# Patient Record
Sex: Female | Born: 1960 | Race: White | Hispanic: No | Marital: Married | Smoking: Former smoker
Health system: Southern US, Community
[De-identification: ages and names within clinical notes are randomized; demographics above are authoritative.]

## PROBLEM LIST (undated history)

## (undated) DIAGNOSIS — K219 Gastro-esophageal reflux disease without esophagitis: Secondary | ICD-10-CM

## (undated) DIAGNOSIS — A64 Unspecified sexually transmitted disease: Secondary | ICD-10-CM

## (undated) DIAGNOSIS — I10 Essential (primary) hypertension: Secondary | ICD-10-CM

## (undated) DIAGNOSIS — M8580 Other specified disorders of bone density and structure, unspecified site: Secondary | ICD-10-CM

## (undated) DIAGNOSIS — E079 Disorder of thyroid, unspecified: Secondary | ICD-10-CM

## (undated) DIAGNOSIS — K5790 Diverticulosis of intestine, part unspecified, without perforation or abscess without bleeding: Secondary | ICD-10-CM

## (undated) DIAGNOSIS — K222 Esophageal obstruction: Secondary | ICD-10-CM

## (undated) DIAGNOSIS — D126 Benign neoplasm of colon, unspecified: Secondary | ICD-10-CM

## (undated) HISTORY — DX: Disorder of thyroid, unspecified: E07.9

## (undated) HISTORY — DX: Benign neoplasm of colon, unspecified: D12.6

## (undated) HISTORY — DX: Esophageal obstruction: K22.2

## (undated) HISTORY — DX: Gastro-esophageal reflux disease without esophagitis: K21.9

## (undated) HISTORY — DX: Diverticulosis of intestine, part unspecified, without perforation or abscess without bleeding: K57.90

## (undated) HISTORY — PX: CARPAL TUNNEL RELEASE: SHX101

## (undated) HISTORY — DX: Unspecified sexually transmitted disease: A64

## (undated) HISTORY — DX: Essential (primary) hypertension: I10

## (undated) HISTORY — PX: TUBAL LIGATION: SHX77

## (undated) HISTORY — PX: FOOT SURGERY: SHX648

## (undated) HISTORY — DX: Other specified disorders of bone density and structure, unspecified site: M85.80

---

## 1999-05-15 ENCOUNTER — Encounter: Payer: Self-pay | Admitting: Obstetrics and Gynecology

## 1999-05-15 ENCOUNTER — Encounter: Admission: RE | Admit: 1999-05-15 | Discharge: 1999-05-15 | Payer: Self-pay | Admitting: Obstetrics and Gynecology

## 2002-01-13 ENCOUNTER — Encounter: Payer: Self-pay | Admitting: Otolaryngology

## 2002-01-13 ENCOUNTER — Encounter: Admission: RE | Admit: 2002-01-13 | Discharge: 2002-01-13 | Payer: Self-pay | Admitting: Otolaryngology

## 2003-04-22 ENCOUNTER — Other Ambulatory Visit: Admission: RE | Admit: 2003-04-22 | Discharge: 2003-04-22 | Payer: Self-pay | Admitting: Obstetrics and Gynecology

## 2003-05-16 ENCOUNTER — Encounter: Admission: RE | Admit: 2003-05-16 | Discharge: 2003-05-16 | Payer: Self-pay | Admitting: Otolaryngology

## 2004-09-05 ENCOUNTER — Other Ambulatory Visit: Admission: RE | Admit: 2004-09-05 | Discharge: 2004-09-05 | Payer: Self-pay | Admitting: Obstetrics and Gynecology

## 2006-02-21 ENCOUNTER — Other Ambulatory Visit: Admission: RE | Admit: 2006-02-21 | Discharge: 2006-02-21 | Payer: Self-pay | Admitting: Obstetrics and Gynecology

## 2007-07-20 ENCOUNTER — Other Ambulatory Visit: Admission: RE | Admit: 2007-07-20 | Discharge: 2007-07-20 | Payer: Self-pay | Admitting: Obstetrics and Gynecology

## 2008-07-27 ENCOUNTER — Other Ambulatory Visit: Admission: RE | Admit: 2008-07-27 | Discharge: 2008-07-27 | Payer: Self-pay | Admitting: Obstetrics and Gynecology

## 2008-07-27 ENCOUNTER — Ambulatory Visit: Payer: Self-pay | Admitting: Obstetrics and Gynecology

## 2008-07-27 ENCOUNTER — Encounter: Payer: Self-pay | Admitting: Obstetrics and Gynecology

## 2008-08-02 ENCOUNTER — Encounter: Admission: RE | Admit: 2008-08-02 | Discharge: 2008-08-02 | Payer: Self-pay | Admitting: Obstetrics and Gynecology

## 2008-10-25 ENCOUNTER — Ambulatory Visit: Payer: Self-pay | Admitting: Obstetrics and Gynecology

## 2009-01-23 ENCOUNTER — Ambulatory Visit: Payer: Self-pay | Admitting: Obstetrics and Gynecology

## 2009-01-23 ENCOUNTER — Ambulatory Visit: Payer: Self-pay | Admitting: Women's Health

## 2009-04-03 ENCOUNTER — Ambulatory Visit: Payer: Self-pay | Admitting: Obstetrics and Gynecology

## 2009-08-14 ENCOUNTER — Other Ambulatory Visit: Admission: RE | Admit: 2009-08-14 | Discharge: 2009-08-14 | Payer: Self-pay | Admitting: Obstetrics and Gynecology

## 2009-08-14 ENCOUNTER — Ambulatory Visit: Payer: Self-pay | Admitting: Obstetrics and Gynecology

## 2009-09-13 ENCOUNTER — Encounter: Admission: RE | Admit: 2009-09-13 | Discharge: 2009-09-13 | Payer: Self-pay | Admitting: Obstetrics and Gynecology

## 2010-04-11 ENCOUNTER — Ambulatory Visit: Payer: Self-pay | Admitting: Obstetrics and Gynecology

## 2010-06-05 ENCOUNTER — Other Ambulatory Visit: Payer: Self-pay | Admitting: Obstetrics and Gynecology

## 2010-06-05 ENCOUNTER — Ambulatory Visit
Admission: RE | Admit: 2010-06-05 | Discharge: 2010-06-05 | Payer: Self-pay | Source: Home / Self Care | Attending: Obstetrics and Gynecology | Admitting: Obstetrics and Gynecology

## 2010-06-27 ENCOUNTER — Ambulatory Visit: Payer: Self-pay | Admitting: Obstetrics and Gynecology

## 2010-06-27 ENCOUNTER — Other Ambulatory Visit: Payer: Self-pay

## 2010-06-27 ENCOUNTER — Other Ambulatory Visit: Payer: Managed Care, Other (non HMO)

## 2010-06-27 ENCOUNTER — Ambulatory Visit (INDEPENDENT_AMBULATORY_CARE_PROVIDER_SITE_OTHER): Payer: Managed Care, Other (non HMO) | Admitting: Obstetrics and Gynecology

## 2010-06-27 DIAGNOSIS — N949 Unspecified condition associated with female genital organs and menstrual cycle: Secondary | ICD-10-CM

## 2010-06-27 DIAGNOSIS — N920 Excessive and frequent menstruation with regular cycle: Secondary | ICD-10-CM

## 2010-06-27 DIAGNOSIS — D259 Leiomyoma of uterus, unspecified: Secondary | ICD-10-CM

## 2010-09-07 ENCOUNTER — Other Ambulatory Visit: Payer: Managed Care, Other (non HMO)

## 2010-09-11 ENCOUNTER — Institutional Professional Consult (permissible substitution) (INDEPENDENT_AMBULATORY_CARE_PROVIDER_SITE_OTHER): Payer: Managed Care, Other (non HMO) | Admitting: Obstetrics and Gynecology

## 2010-09-11 DIAGNOSIS — N921 Excessive and frequent menstruation with irregular cycle: Secondary | ICD-10-CM

## 2010-09-11 DIAGNOSIS — D250 Submucous leiomyoma of uterus: Secondary | ICD-10-CM

## 2010-09-11 DIAGNOSIS — N920 Excessive and frequent menstruation with regular cycle: Secondary | ICD-10-CM

## 2010-11-01 ENCOUNTER — Ambulatory Visit: Payer: Managed Care, Other (non HMO) | Admitting: Obstetrics and Gynecology

## 2010-11-02 ENCOUNTER — Inpatient Hospital Stay (HOSPITAL_COMMUNITY): Admission: RE | Admit: 2010-11-02 | Payer: Managed Care, Other (non HMO) | Source: Ambulatory Visit

## 2010-11-14 ENCOUNTER — Encounter (HOSPITAL_COMMUNITY): Admission: RE | Payer: Self-pay | Source: Ambulatory Visit

## 2010-11-14 ENCOUNTER — Ambulatory Visit (HOSPITAL_COMMUNITY)
Admission: RE | Admit: 2010-11-14 | Payer: Managed Care, Other (non HMO) | Source: Ambulatory Visit | Admitting: Obstetrics and Gynecology

## 2010-11-14 SURGERY — HYSTERECTOMY, VAGINAL
Anesthesia: General

## 2011-03-14 ENCOUNTER — Encounter: Payer: Self-pay | Admitting: Gynecology

## 2011-03-14 DIAGNOSIS — E079 Disorder of thyroid, unspecified: Secondary | ICD-10-CM | POA: Insufficient documentation

## 2011-03-14 DIAGNOSIS — I10 Essential (primary) hypertension: Secondary | ICD-10-CM | POA: Insufficient documentation

## 2011-03-15 ENCOUNTER — Encounter: Payer: Self-pay | Admitting: Women's Health

## 2011-03-15 ENCOUNTER — Other Ambulatory Visit (HOSPITAL_COMMUNITY)
Admission: RE | Admit: 2011-03-15 | Discharge: 2011-03-15 | Disposition: A | Discharge status: Home / Self Care | Payer: Managed Care, Other (non HMO) | Source: Ambulatory Visit | Attending: Women's Health | Admitting: Women's Health

## 2011-03-15 ENCOUNTER — Other Ambulatory Visit: Payer: Self-pay | Admitting: Obstetrics and Gynecology

## 2011-03-15 ENCOUNTER — Ambulatory Visit (INDEPENDENT_AMBULATORY_CARE_PROVIDER_SITE_OTHER): Payer: Managed Care, Other (non HMO) | Admitting: Women's Health

## 2011-03-15 VITALS — BP 130/80 | Ht 62.0 in | Wt 167.0 lb

## 2011-03-15 DIAGNOSIS — Z01419 Encounter for gynecological examination (general) (routine) without abnormal findings: Secondary | ICD-10-CM

## 2011-03-15 DIAGNOSIS — Z833 Family history of diabetes mellitus: Secondary | ICD-10-CM

## 2011-03-15 DIAGNOSIS — B373 Candidiasis of vulva and vagina: Secondary | ICD-10-CM

## 2011-03-15 DIAGNOSIS — E039 Hypothyroidism, unspecified: Secondary | ICD-10-CM

## 2011-03-15 DIAGNOSIS — R823 Hemoglobinuria: Secondary | ICD-10-CM

## 2011-03-15 DIAGNOSIS — Z13220 Encounter for screening for lipoid disorders: Secondary | ICD-10-CM

## 2011-03-15 DIAGNOSIS — L299 Pruritus, unspecified: Secondary | ICD-10-CM

## 2011-03-15 DIAGNOSIS — Z1231 Encounter for screening mammogram for malignant neoplasm of breast: Secondary | ICD-10-CM

## 2011-03-15 MED ORDER — TERCONAZOLE 0.4 % VA CREA: 1.0000 | TOPICAL_CREAM | Freq: Every day | VAGINAL | Status: AC

## 2011-03-15 MED ORDER — LEVOTHYROXINE SODIUM 100 MCG PO TABS
100.0000 ug | ORAL_TABLET | Freq: Every day | ORAL | Status: DC
Start: 2011-03-15 — End: 2012-03-17

## 2011-03-15 NOTE — Progress Notes (Signed)
Hannah Hanson 08/31/1960 [id number]    History:    The patient presents for annual exam.  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   Past medical history, past surgical history, family history and social history were all reviewed and documented in the EPIC chart.   ROS:  A  ROS was performed and pertinent positives and negatives are included in the history.  Exam:  Filed Vitals:   03/15/11 1202  BP: 130/80    General appearance:  Normal Head/Neck:  Normal, without cervical or supraclavicular adenopathy. Thyroid:  Symmetrical, normal in size, without palpable masses or nodularity. Respiratory  Effort:  Normal  Auscultation:  Clear without wheezing or rhonchi Cardiovascular  Auscultation:  Regular rate, without rubs, murmurs or gallops  Edema/varicosities:  Not grossly evident Abdominal  Soft,nontender, without masses, guarding or rebound.  Liver/spleen:  No organomegaly noted  Hernia:  None appreciated  Skin  Inspection:  Grossly normal  Palpation:  Grossly normal Neurologic/psychiatric  Orientation:  Normal with appropriate conversation.  Mood/affect:  Normal  Genitourinary    Breasts: Examined lying and sitting.     Right: Without masses, retractions, discharge or axillary adenopathy.     Left: Without masses, retractions, discharge or axillary adenopathy.   Inguinal/mons:  Normal without inguinal adenopathy  External genitalia:  Normal  BUS/Urethra/Skene's glands:  Normal  Bladder:  Normal  Vagina:  Normal  Cervix:  Normal  Uterus:  normal in size, shape and contour.  Midline and mobile  Adnexa/parametria:     Rt: Without masses or tenderness.   Lt: Without masses or tenderness.  Anus and perineum: Normal  Digital rectal exam: Normal sphincter tone without palpated masses or tenderness  Assessment/Plan:  50 y.o. MWF G4P3 for annual exam with numerous complaints. Monthly cycle, 5-10 days, usually under 7.  History of an intramural fibroid that displaces the endometrial cavity 43 x 29 mm noted on sonohysterogram January 2012. Was contemplating hysterectomy, states would prefer not since so close to menopause. History of normal mammograms, last one may of 2011. Has not had a colonoscopy. History of normal Paps.  Menorrhagia/fibroid Yeast Hypertension/depression-medications - primary care Hypothyroid/levothyroxine 100 mcg daily  Plan: CBC, glucose, TSH, UA and Pap. Prescription, proper use, levothyroxine 100 mcg daily, will triage based on results. Instructed to keep a menstrual calendar, return to office if cycles greater than 7 days or heavier than 1 pad per hour. SBEs, mammogram overdue, instructed to schedule. Encouraged exercise, vitamin D 1000 daily, calcium rich diet. Instructed to schedule colonoscopy, has numerous GI complaints, Dr. Matthias Hughs, Dr. Loreta Ave number was given. Terazol 7 one applicator at bedtime x7 and apply externally, most of her itching is external. Call if no relief.  Harrington Challenger [facility], 12:57 PM 03/15/2011

## 2011-03-20 ENCOUNTER — Other Ambulatory Visit: Payer: Self-pay | Admitting: *Deleted

## 2011-03-20 ENCOUNTER — Encounter: Payer: Self-pay | Admitting: Gastroenterology

## 2011-03-20 ENCOUNTER — Telehealth: Payer: Self-pay | Admitting: *Deleted

## 2011-03-20 DIAGNOSIS — Z1211 Encounter for screening for malignant neoplasm of colon: Secondary | ICD-10-CM

## 2011-03-20 NOTE — Telephone Encounter (Signed)
Patient needed help with setting up a screening colonoscopy.  Patient wanted to see [facility].  Set up with Dr. Christella Hartigan on 04/15/11 @11 :30 order in pc.  Patient informed packet to be sent out by their office.

## 2011-04-04 ENCOUNTER — Ambulatory Visit (AMBULATORY_SURGERY_CENTER): Payer: Managed Care, Other (non HMO)

## 2011-04-04 VITALS — Ht 61.0 in | Wt 166.0 lb

## 2011-04-04 DIAGNOSIS — Z1211 Encounter for screening for malignant neoplasm of colon: Secondary | ICD-10-CM

## 2011-04-04 MED ORDER — PEG-KCL-NACL-NASULF-NA ASC-C 100 G PO SOLR: 1.0000 | Freq: Once | ORAL | Status: DC

## 2011-04-05 ENCOUNTER — Encounter: Payer: Self-pay | Admitting: Gastroenterology

## 2011-04-10 ENCOUNTER — Ambulatory Visit
Admission: RE | Admit: 2011-04-10 | Discharge: 2011-04-10 | Disposition: A | Discharge status: Home / Self Care | Payer: Managed Care, Other (non HMO) | Source: Ambulatory Visit | Attending: Obstetrics and Gynecology | Admitting: Obstetrics and Gynecology

## 2011-04-10 DIAGNOSIS — Z1231 Encounter for screening mammogram for malignant neoplasm of breast: Secondary | ICD-10-CM

## 2011-04-15 ENCOUNTER — Other Ambulatory Visit: Payer: Self-pay | Admitting: Obstetrics and Gynecology

## 2011-04-15 ENCOUNTER — Other Ambulatory Visit: Payer: Self-pay | Admitting: *Deleted

## 2011-04-15 ENCOUNTER — Encounter: Payer: Self-pay | Admitting: Gastroenterology

## 2011-04-15 ENCOUNTER — Ambulatory Visit (AMBULATORY_SURGERY_CENTER): Payer: Managed Care, Other (non HMO) | Admitting: Gastroenterology

## 2011-04-15 VITALS — BP 117/55 | HR 66 | Temp 99.5°F | Resp 19 | Ht 61.0 in | Wt 166.0 lb

## 2011-04-15 DIAGNOSIS — Z1211 Encounter for screening for malignant neoplasm of colon: Secondary | ICD-10-CM

## 2011-04-15 DIAGNOSIS — N63 Unspecified lump in unspecified breast: Secondary | ICD-10-CM

## 2011-04-15 MED ORDER — SODIUM CHLORIDE 0.9 % IV SOLN: 500.0000 mL | INTRAVENOUS | Status: DC

## 2011-04-15 NOTE — Op Note (Signed)
[facility] [address] [place], [place]  [address]  COLONOSCOPY PROCEDURE REPORT  PATIENT:  Hannah Hanson, Hannah Hanson  MR#:  [id number] BIRTHDATE:  11/05/1960, 50 yrs. old  GENDER:  female ENDOSCOPIST:  Rachael Fee, MD REF. BY:  Burnell Blanks, M.D. PROCEDURE DATE:  04/15/2011 PROCEDURE:  Diagnostic Colonoscopy ASA CLASS:  Class II INDICATIONS:  Routine Risk Screening MEDICATIONS:  Fentanyl 75 mcg IV, These medications were titrated to patient response per physician's verbal order, Versed 8 mg IV  DESCRIPTION OF PROCEDURE:   After the risks benefits and alternatives of the procedure were thoroughly explained, informed consent was obtained.  Digital rectal exam was performed and revealed no rectal masses.   The [id number] endoscope was introduced through the anus and advanced to the cecum, which was identified by both the appendix and ileocecal valve, without limitations.  The quality of the prep was good..  The instrument was then slowly withdrawn as the colon was fully examined. <<PROCEDUREIMAGES>>  FINDINGS:  A normal appearing cecum, ileocecal valve, and appendiceal orifice were identified. The ascending, hepatic flexure, transverse, splenic flexure, descending, sigmoid colon, and rectum appeared unremarkable (see image1, image2, and image3). Retroflexed views in the rectum revealed no abnormalities.  COMPLICATIONS:  None  ENDOSCOPIC IMPRESSION: 1) Normal colon 2) No polyps or cancers  RECOMMENDATIONS: 1) You should continue to follow colorectal cancer screening guidelines for "routine risk" patients with a repeat colonoscopy in 10 years. There is no need for FOBT (stool) testing for at least 5 years.  REPEAT EXAM:  10 years  ______________________________ Rachael Fee, MD  n. eSIGNED:   Rachael Fee at 04/15/2011 11:30 AM  Jannifer Hick, [phone]

## 2011-04-15 NOTE — Progress Notes (Signed)
Pressure applied to the abdomen to reach cecum. 

## 2011-04-15 NOTE — Patient Instructions (Signed)
Please refer to your blue and neon green sheets for instructions regarding diet and activity for the rest of today.  You may resume your medications as you would normally take them.  

## 2011-04-15 NOTE — Progress Notes (Signed)
Patient did not experience any of the following events: a burn prior to discharge; a fall within the facility; wrong site/side/patient/procedure/implant event; or a hospital transfer or hospital admission upon discharge from the facility. (G8907) Patient did not have preoperative order for IV antibiotic SSI prophylaxis. (G8918)  

## 2011-04-16 ENCOUNTER — Telehealth: Payer: Self-pay | Admitting: *Deleted

## 2011-04-16 NOTE — Telephone Encounter (Signed)
Left message on number left in admitting with ok to leave that message that identified pt by first and last name. ewm

## 2011-04-18 ENCOUNTER — Other Ambulatory Visit: Payer: Managed Care, Other (non HMO)

## 2011-05-02 ENCOUNTER — Ambulatory Visit
Admission: RE | Admit: 2011-05-02 | Discharge: 2011-05-02 | Disposition: A | Discharge status: Home / Self Care | Payer: Managed Care, Other (non HMO) | Source: Ambulatory Visit | Attending: Gynecology | Admitting: Gynecology

## 2011-05-02 DIAGNOSIS — N63 Unspecified lump in unspecified breast: Secondary | ICD-10-CM

## 2011-10-14 ENCOUNTER — Other Ambulatory Visit: Payer: Self-pay | Admitting: Obstetrics and Gynecology

## 2011-10-15 ENCOUNTER — Other Ambulatory Visit: Payer: Self-pay | Admitting: *Deleted

## 2011-10-15 DIAGNOSIS — R928 Other abnormal and inconclusive findings on diagnostic imaging of breast: Secondary | ICD-10-CM

## 2011-12-30 ENCOUNTER — Ambulatory Visit
Admission: RE | Admit: 2011-12-30 | Discharge: 2011-12-30 | Disposition: A | Discharge status: Home / Self Care | Payer: BC Managed Care – PPO | Source: Ambulatory Visit | Attending: Obstetrics and Gynecology | Admitting: Obstetrics and Gynecology

## 2011-12-30 DIAGNOSIS — R928 Other abnormal and inconclusive findings on diagnostic imaging of breast: Secondary | ICD-10-CM

## 2012-01-30 ENCOUNTER — Encounter: Payer: Self-pay | Admitting: Women's Health

## 2012-01-30 ENCOUNTER — Ambulatory Visit (INDEPENDENT_AMBULATORY_CARE_PROVIDER_SITE_OTHER): Payer: BC Managed Care – PPO | Admitting: Women's Health

## 2012-01-30 DIAGNOSIS — N390 Urinary tract infection, site not specified: Secondary | ICD-10-CM

## 2012-01-30 DIAGNOSIS — N898 Other specified noninflammatory disorders of vagina: Secondary | ICD-10-CM

## 2012-01-30 DIAGNOSIS — R350 Frequency of micturition: Secondary | ICD-10-CM

## 2012-01-30 LAB — URINALYSIS W MICROSCOPIC + REFLEX CULTURE
Bilirubin Urine: NEGATIVE
Glucose, UA: NEGATIVE mg/dL
Hgb urine dipstick: NEGATIVE
Ketones, ur: NEGATIVE mg/dL
Protein, ur: NEGATIVE mg/dL
RBC / HPF: NONE SEEN RBC/hpf (ref <3)

## 2012-01-30 LAB — WET PREP FOR TRICH, YEAST, CLUE
Trich, Wet Prep: NONE SEEN
Yeast Wet Prep HPF POC: NONE SEEN

## 2012-01-30 MED ORDER — FLUCONAZOLE 150 MG PO TABS: 150.0000 mg | ORAL_TABLET | Freq: Once | ORAL | Status: DC

## 2012-01-30 NOTE — Patient Instructions (Addendum)
Monilial Vaginitis Vaginitis in a soreness, swelling and redness (inflammation) of the vagina and vulva. Monilial vaginitis is not a sexually transmitted infection. CAUSES  Yeast vaginitis is caused by yeast (candida) that is normally found in your vagina. With a yeast infection, the candida has overgrown in number to a point that upsets the chemical balance. SYMPTOMS   White, thick vaginal discharge.   Swelling, itching, redness and irritation of the vagina and possibly the lips of the vagina (vulva).   Burning or painful urination.   Painful intercourse.  DIAGNOSIS  Things that may contribute to monilial vaginitis are:  Postmenopausal and virginal states.   Pregnancy.   Infections.   Being tired, sick or stressed, especially if you had monilial vaginitis in the past.   Diabetes. Good control will help lower the chance.   Birth control pills.   Tight fitting garments.   Using bubble bath, feminine sprays, douches or deodorant tampons.   Taking certain medications that kill germs (antibiotics).   Sporadic recurrence can occur if you become ill.  TREATMENT  Your caregiver will give you medication.  There are several kinds of anti monilial vaginal creams and suppositories specific for monilial vaginitis. For recurrent yeast infections, use a suppository or cream in the vagina 2 times a week, or as directed.   Anti-monilial or steroid cream for the itching or irritation of the vulva may also be used. Get your caregiver's permission.   Painting the vagina with methylene blue solution may help if the monilial cream does not work.   Eating yogurt may help prevent monilial vaginitis.  HOME CARE INSTRUCTIONS   Finish all medication as prescribed.   Do not have sex until treatment is completed or after your caregiver tells you it is okay.   Take warm sitz baths.   Do not douche.   Do not use tampons, especially scented ones.   Wear cotton underwear.   Avoid tight  pants and panty hose.   Tell your sexual partner that you have a yeast infection. They should go to their caregiver if they have symptoms such as mild rash or itching.   Your sexual partner should be treated as well if your infection is difficult to eliminate.   Practice safer sex. Use condoms.   Some vaginal medications cause latex condoms to fail. Vaginal medications that harm condoms are:   Cleocin cream.   Butoconazole (Femstat).   Terconazole (Terazol) vaginal suppository.   Miconazole (Monistat) (may be purchased over the counter).  SEEK MEDICAL CARE IF:   You have a temperature by mouth above 102 F (38.9 C).   The infection is getting worse after 2 days of treatment.   The infection is not getting better after 3 days of treatment.   You develop blisters in or around your vagina.   You develop vaginal bleeding, and it is not your menstrual period.   You have pain when you urinate.   You develop intestinal problems.   You have pain with sexual intercourse.  Document Released: 01/30/2005 Document Revised: 04/11/2011 Document Reviewed: 10/14/2008 ExitCare Patient Information 2012 [employer]. 

## 2012-01-30 NOTE — Progress Notes (Signed)
Patient ID: Hannah Hanson, female   DOB: 01-14-1961, 51 y.o.   MRN: [id number] Presents with the complaint of vaginal discharge with itching and irritation. States has had some urinary frequency and slight burning for one day. Used over-the-counter Monistat yesterday without relief. Denies a fever.  Exam: No CVAT. UA: Trace leukocytes, 0 did 2 WBCs, few bacteria, urine culture pending. External genitalia extremely erythematous, speculum exam vaginal walls are erythematous, copious curdy discharge mixed with vaginal cream. Wet prep negative.  Symptomatic yeast  Plan: Diflucan 150 by mouth x1 dose, prescription, proper use, given and reviewed. Instructed to call if no relief of symptoms. Urine culture pending.

## 2012-02-01 LAB — URINE CULTURE: Organism ID, Bacteria: NO GROWTH

## 2012-03-17 ENCOUNTER — Other Ambulatory Visit: Payer: Self-pay | Admitting: *Deleted

## 2012-03-17 MED ORDER — LEVOTHYROXINE SODIUM 100 MCG PO TABS: 100.0000 ug | ORAL_TABLET | Freq: Every day | ORAL | Status: DC

## 2012-03-18 ENCOUNTER — Ambulatory Visit (INDEPENDENT_AMBULATORY_CARE_PROVIDER_SITE_OTHER): Payer: BC Managed Care – PPO | Admitting: Women's Health

## 2012-03-18 ENCOUNTER — Encounter: Payer: Self-pay | Admitting: Women's Health

## 2012-03-18 VITALS — BP 124/76 | Ht 62.0 in | Wt 168.0 lb

## 2012-03-18 DIAGNOSIS — F419 Anxiety disorder, unspecified: Secondary | ICD-10-CM

## 2012-03-18 DIAGNOSIS — Z01419 Encounter for gynecological examination (general) (routine) without abnormal findings: Secondary | ICD-10-CM

## 2012-03-18 DIAGNOSIS — F329 Major depressive disorder, single episode, unspecified: Secondary | ICD-10-CM | POA: Insufficient documentation

## 2012-03-18 DIAGNOSIS — F341 Dysthymic disorder: Secondary | ICD-10-CM

## 2012-03-18 DIAGNOSIS — Z833 Family history of diabetes mellitus: Secondary | ICD-10-CM

## 2012-03-18 DIAGNOSIS — Z13220 Encounter for screening for lipoid disorders: Secondary | ICD-10-CM

## 2012-03-18 DIAGNOSIS — F32A Depression, unspecified: Secondary | ICD-10-CM | POA: Insufficient documentation

## 2012-03-18 DIAGNOSIS — E039 Hypothyroidism, unspecified: Secondary | ICD-10-CM

## 2012-03-18 MED ORDER — LEVOTHYROXINE SODIUM 100 MCG PO TABS: 100.0000 ug | ORAL_TABLET | Freq: Every day | ORAL | Status: DC

## 2012-03-18 MED ORDER — SERTRALINE HCL 50 MG PO TABS: 50.0000 mg | ORAL_TABLET | Freq: Every day | ORAL | Status: DC

## 2012-03-18 NOTE — Progress Notes (Signed)
Hannah Hanson 1960-07-23 [id number]    History:    The patient presents for annual exam.  Monthly 5-7 day cycle/BTL. Hypothyroid- Synthroid 100 mcg, anxiety/depression- Zoloft 50 mg daily. Hypertension, lisinopril per primary care. History of normal  mammogram 2013 after ultrasound and diagnostic mammogram of left breast. Pap 2010 - endometrial cells present with a negative endometrial biopsy, normal Paps after. Negative colonoscopy 04/2011. HSV with rare outbreaks. History of elevated cholesterol with low HDL, without followup. Has occasional reflux and right upper quadrant pain.   Past medical history, past surgical history, family history and social history were all reviewed and documented in the EPIC chart. Works at [employer] part time. 3 children, 28, 26, 25 all struggle with anxiety. History of GDM. Mother with history of diabetes and hypertension.   ROS:  A  ROS was performed and pertinent positives and negatives are included in the history.  Exam:  Filed Vitals:   03/18/12 1404  BP: 124/76    General appearance:  Normal Head/Neck:  Normal, without cervical or supraclavicular adenopathy. Thyroid:  Symmetrical, normal in size, without palpable masses or nodularity. Respiratory  Effort:  Normal  Auscultation:  Clear without wheezing or rhonchi Cardiovascular  Auscultation:  Regular rate, without rubs, murmurs or gallops  Edema/varicosities:  Not grossly evident Abdominal  Soft,nontender, without masses, guarding or rebound.  Liver/spleen:  No organomegaly noted  Hernia:  None appreciated  Skin  Inspection:  Grossly normal  Palpation:  Grossly normal Neurologic/psychiatric  Orientation:  Normal with appropriate conversation.  Mood/affect:  Normal  Genitourinary    Breasts: Examined lying and sitting.     Right: Without masses, retractions, discharge or axillary adenopathy.     Left: Without masses, retractions, discharge or axillary adenopathy.   Inguinal/mons:  Normal  without inguinal adenopathy  External genitalia:  Normal  BUS/Urethra/Skene's glands:  Normal  Bladder:  Normal  Vagina:  Normal  Cervix:  Normal  Uterus:   normal in size, shape and contour.  Midline and mobile  Adnexa/parametria:     Rt: Without masses or tenderness.   Lt: Without masses or tenderness.  Anus and perineum: Normal  Digital rectal exam: Normal sphincter tone without palpated masses or tenderness  Assessment/Plan:  51 y.o. M. WF G4 P3  for annual exam.     Normal GYN exam/BTL Hypothyroid-Synthroid Hypertension-lisinopril-primary care Untreated  hypercholesteremia Anxiety/depression-stable on Zoloft 50 Reflux and occasional right upper quadrant pain  Plan: Synthroid 100 mcg prescription, proper use given and reviewed, will triage note for change on TSH. Zoloft 50 mg by mouth daily prescription, proper use given and reviewed. Declines need for counseling, has had in the past. SBE's, continue annual mammogram, calcium rich diet, vitamin D 2000 daily, increase exercise and decrease calories for weight loss. Menopause reviewed. CBC, glucose, lipid panel, TSH, UA. Pap normal 2012, new screening guidelines reviewed. Reviewed importance of following up with primary care for lisinopril, reflux, questionable gallbladder issues and probable need for cholesterol medication.   Harrington Challenger WHNP, 3:15 PM 03/18/2012

## 2012-03-18 NOTE — Patient Instructions (Addendum)

## 2012-03-19 ENCOUNTER — Encounter: Payer: Self-pay | Admitting: Obstetrics and Gynecology

## 2012-03-19 LAB — URINALYSIS W MICROSCOPIC + REFLEX CULTURE
Bilirubin Urine: NEGATIVE
Hgb urine dipstick: NEGATIVE
Ketones, ur: NEGATIVE mg/dL
Protein, ur: NEGATIVE mg/dL
Urobilinogen, UA: 0.2 mg/dL (ref 0.0–1.0)

## 2012-03-19 LAB — LIPID PANEL
Cholesterol: 206 mg/dL — ABNORMAL HIGH (ref 0–200)
HDL: 37 mg/dL — ABNORMAL LOW (ref >39)
Total CHOL/HDL Ratio: 5.6 Ratio
VLDL: 64 mg/dL — ABNORMAL HIGH (ref 0–40)

## 2012-03-19 LAB — CBC WITH DIFFERENTIAL/PLATELET
Basophils Relative: 0 % (ref 0–1)
HCT: 43.4 % (ref 36.0–46.0)
Hemoglobin: 15 g/dL (ref 12.0–15.0)
MCH: 31.3 pg (ref 26.0–34.0)
MCHC: 34.6 g/dL (ref 30.0–36.0)
Monocytes Absolute: 0.8 10*3/uL (ref 0.1–1.0)
Monocytes Relative: 8 % (ref 3–12)
Neutro Abs: 5.6 10*3/uL (ref 1.7–7.7)

## 2012-03-19 LAB — TSH: TSH: 2.171 u[IU]/mL (ref 0.350–4.500)

## 2012-03-26 ENCOUNTER — Other Ambulatory Visit: Payer: Self-pay | Admitting: *Deleted

## 2012-03-26 DIAGNOSIS — E039 Hypothyroidism, unspecified: Secondary | ICD-10-CM

## 2012-03-26 MED ORDER — LEVOTHYROXINE SODIUM 100 MCG PO TABS: 100.0000 ug | ORAL_TABLET | Freq: Every day | ORAL | Status: DC

## 2012-08-04 ENCOUNTER — Other Ambulatory Visit: Payer: Self-pay | Admitting: Women's Health

## 2012-08-04 DIAGNOSIS — N6320 Unspecified lump in the left breast, unspecified quadrant: Secondary | ICD-10-CM

## 2012-08-05 ENCOUNTER — Other Ambulatory Visit: Payer: Self-pay | Admitting: *Deleted

## 2012-08-05 DIAGNOSIS — N63 Unspecified lump in unspecified breast: Secondary | ICD-10-CM

## 2012-08-27 ENCOUNTER — Ambulatory Visit
Admission: RE | Admit: 2012-08-27 | Discharge: 2012-08-27 | Disposition: A | Discharge status: Home / Self Care | Payer: BC Managed Care – PPO | Source: Ambulatory Visit | Attending: Women's Health | Admitting: Women's Health

## 2012-08-27 DIAGNOSIS — N63 Unspecified lump in unspecified breast: Secondary | ICD-10-CM

## 2012-12-30 ENCOUNTER — Other Ambulatory Visit: Payer: Self-pay | Admitting: Family Medicine

## 2012-12-30 DIAGNOSIS — R1310 Dysphagia, unspecified: Secondary | ICD-10-CM

## 2012-12-31 ENCOUNTER — Ambulatory Visit
Admission: RE | Admit: 2012-12-31 | Discharge: 2012-12-31 | Disposition: A | Discharge status: Home / Self Care | Payer: BC Managed Care – PPO | Source: Ambulatory Visit | Attending: Family Medicine | Admitting: Family Medicine

## 2012-12-31 DIAGNOSIS — R1310 Dysphagia, unspecified: Secondary | ICD-10-CM

## 2013-01-12 ENCOUNTER — Encounter: Payer: Self-pay | Admitting: Gastroenterology

## 2013-01-28 ENCOUNTER — Encounter: Payer: Self-pay | Admitting: Gastroenterology

## 2013-02-15 ENCOUNTER — Encounter: Payer: Self-pay | Admitting: Gastroenterology

## 2013-02-15 ENCOUNTER — Ambulatory Visit (INDEPENDENT_AMBULATORY_CARE_PROVIDER_SITE_OTHER): Payer: BC Managed Care – PPO | Admitting: Gastroenterology

## 2013-02-15 VITALS — BP 120/88 | HR 72 | Ht 62.0 in | Wt 167.0 lb

## 2013-02-15 DIAGNOSIS — R1310 Dysphagia, unspecified: Secondary | ICD-10-CM

## 2013-02-15 DIAGNOSIS — K222 Esophageal obstruction: Secondary | ICD-10-CM

## 2013-02-15 NOTE — Patient Instructions (Signed)
You will be set up for an upper endoscopy with balloon dilation for dysphagia, known esophageal stricture.

## 2013-02-15 NOTE — Progress Notes (Signed)
Review of pertinent gastrointestinal problems: 1. Routine risk for colon cancer, colonoscopy Dr. Christella Hartigan December 2012 done for routine risk screening. Colonoscopy was normal. Recommended recall at 10 year interval.   HPI: This is a   very pleasant 52 year old woman whom I am meeting in the office for the first time.  Has intermittent dysphagia 2-3 times per week.  Usually just solids, rarely liquids.  Usually relaxes and the bolus eases through. Vomited once.  Goes back 1-2 years.  + pyrosis, burning in chest.  Pepcid intemrittently for about a year.  More recently nexium.  Overall weight is up 20 pounds in 3 years.    from Barium esophagram 12/2012 done for dysphagia: Small hiatal hernia with a mucosal ring. This causes delayed passage at the gastroesophageal junction, suspicious for mild but clinically significant Schatzki ring.    Review of systems: Pertinent positive and negative review of systems were noted in the above HPI section. Complete review of systems was performed and was otherwise normal.    Past Medical History  Diagnosis Date  . STD (sexually transmitted disease)     HSV  . Hypertension   . Thyroid disease     Hypothyroid-Nodule    Past Surgical History  Procedure Laterality Date  . Tubal ligation    . Foot surgery      left  . Cesarean section  [id number], [id number]    Current Outpatient Prescriptions  Medication Sig Dispense Refill  . IRON PO Take by mouth.      . levothyroxine (SYNTHROID, LEVOTHROID) 100 MCG tablet Take 1 tablet (100 mcg total) by mouth daily.  90 tablet  12  . lisinopril (PRINIVIL,ZESTRIL) 10 MG tablet Take 10 mg by mouth daily.        . Multiple Vitamin (MULTIVITAMIN) tablet Take 1 tablet by mouth daily.        . sertraline (ZOLOFT) 50 MG tablet Take 1 tablet (50 mg total) by mouth daily.  90 tablet  4   No current facility-administered medications for this visit.    Allergies as of 02/15/2013 - Review Complete 02/15/2013  Allergen  Reaction Noted  . Sulfa antibiotics Rash 10/25/2010    Family History  Problem Relation Age of Onset  . Diabetes Mother   . Hypertension Mother   . Heart disease Paternal Grandfather   . Colon cancer Neg Hx     History   Social History  . Marital Status: Married    Spouse Name: N/A    Number of Children: N/A  . Years of Education: N/A   Occupational History  . Not on file.   Social History Main Topics  . Smoking status: Former Games developer  . Smokeless tobacco: Never Used  . Alcohol Use: No  . Drug Use: No  . Sexual Activity: Yes    Partners: Male    Birth Control/ Protection: Surgical     Comment: BTL   Other Topics Concern  . Not on file   Social History Narrative  . No narrative on file       Physical Exam: BP 120/88  Pulse 72  Ht 5\' 2"  (1.575 m)  Wt 167 lb (75.751 kg)  BMI 30.54 kg/m2  LMP 01/15/2013 Constitutional: generally well-appearing Psychiatric: alert and oriented x3 Eyes: extraocular movements intact Mouth: oral pharynx moist, no lesions Neck: supple no lymphadenopathy Cardiovascular: heart regular rate and rhythm Lungs: clear to auscultation bilaterally Abdomen: soft, nontender, nondistended, no obvious ascites, no peritoneal signs, normal bowel sounds Extremities: no lower  extremity edema bilaterally Skin: no lesions on visible extremities    Assessment and plan: 51 y.o. female with  intermittent, nonprogressive, predominantly solid food dysphagia with abnormal barium esophagram  She likely has a Schatzki's ring. I recommended that we proceed with EGD and balloon dilation at her soonest convenience. In the meantime he'll chew her food well, eat slowly and take small bites. She has only intermittent GERD for which H2 blocker has been helping her. She does continue that as needed.

## 2013-03-02 ENCOUNTER — Encounter: Payer: Self-pay | Admitting: Gastroenterology

## 2013-03-02 ENCOUNTER — Ambulatory Visit (AMBULATORY_SURGERY_CENTER): Payer: BC Managed Care – PPO | Admitting: Gastroenterology

## 2013-03-02 VITALS — BP 108/68 | HR 58 | Temp 98.1°F | Resp 14 | Ht 62.0 in | Wt 167.0 lb

## 2013-03-02 DIAGNOSIS — Q391 Atresia of esophagus with tracheo-esophageal fistula: Secondary | ICD-10-CM

## 2013-03-02 DIAGNOSIS — R1310 Dysphagia, unspecified: Secondary | ICD-10-CM

## 2013-03-02 DIAGNOSIS — K222 Esophageal obstruction: Secondary | ICD-10-CM

## 2013-03-02 MED ORDER — SODIUM CHLORIDE 0.9 % IV SOLN: 500.0000 mL | INTRAVENOUS | Status: DC

## 2013-03-02 NOTE — Progress Notes (Signed)
Patient did not experience any of the following events: a burn prior to discharge; a fall within the facility; wrong site/side/patient/procedure/implant event; or a hospital transfer or hospital admission upon discharge from the facility. (G8907) Patient did not have preoperative order for IV antibiotic SSI prophylaxis. (G8918)  

## 2013-03-02 NOTE — Patient Instructions (Signed)
YOU HAD AN ENDOSCOPIC PROCEDURE TODAY AT THE [facility]: Refer to the procedure report that was given to you for any specific questions about what was found during the examination.  If the procedure report does not answer your questions, please call your gastroenterologist to clarify.  If you requested that your care partner not be given the details of your procedure findings, then the procedure report has been included in a sealed envelope for you to review at your convenience later.  YOU SHOULD EXPECT: Some feelings of bloating in the abdomen. Passage of more gas than usual.  Walking can help get rid of the air that was put into your GI tract during the procedure and reduce the bloating. If you had a lower endoscopy (such as a colonoscopy or flexible sigmoidoscopy) you may notice spotting of blood in your stool or on the toilet paper. If you underwent a bowel prep for your procedure, then you may not have a normal bowel movement for a few days.  DIET: See dilation diet- Drink plenty of fluids but you should avoid alcoholic beverages for 24 hours.  ACTIVITY: Your care partner should take you home directly after the procedure.  You should plan to take it easy, moving slowly for the rest of the day.  You can resume normal activity the day after the procedure however you should NOT DRIVE or use heavy machinery for 24 hours (because of the sedation medicines used during the test).    SYMPTOMS TO REPORT IMMEDIATELY: A gastroenterologist can be reached at any hour.  During normal business hours, 8:30 AM to 5:00 PM Monday through Friday, call [phone].  After hours and on weekends, please call the GI answering service at [phone] who will take a message and have the physician on call contact you.   Following upper endoscopy (EGD)  Vomiting of blood or coffee ground material  New chest pain or pain under the shoulder blades  Painful or persistently difficult swallowing  New  shortness of breath  Fever of 100F or higher  Black, tarry-looking stools  FOLLOW UP: If any biopsies were taken you will be contacted by phone or by letter within the next 1-3 weeks.  Call your gastroenterologist if you have not heard about the biopsies in 3 weeks.  Our staff will call the home number listed on your records the next business day following your procedure to check on you and address any questions or concerns that you may have at that time regarding the information given to you following your procedure. This is a courtesy call and so if there is no answer at the home number and we have not heard from you through the emergency physician on call, we will assume that you have returned to your regular daily activities without incident.  SIGNATURES/CONFIDENTIALITY: You and/or your care partner have signed paperwork which will be entered into your electronic medical record.  These signatures attest to the fact that that the information above on your After Visit Summary has been reviewed and is understood.  Full responsibility of the confidentiality of this discharge information lies with you and/or your care-partner.  Dilation diet, hiatal hernia, stricture-handouts given  If swallowing does not improve or becomes worse again in the future please contact Dr. Christella Hartigan.

## 2013-03-02 NOTE — Op Note (Signed)
[facility] [address] [place], [address]   ENDOSCOPY PROCEDURE REPORT  PATIENT: Hannah, Hanson.  MR#: [id number] BIRTHDATE: December 24, 1960 , 52  yrs. old GENDER: Female ENDOSCOPIST: Rachael Fee, MD REFERRED BY:  Burnell Blanks, M.D. PROCEDURE DATE:  03/02/2013 PROCEDURE:  EGD, balloon dilation ASA CLASS:     Class II INDICATIONS:  intermittent dysphagia, abnormal barium esophagram. MEDICATIONS: Fentanyl 50 mcg IV, Versed 5 mg IV, and These medications were titrated to patient response per physician's verbal order TOPICAL ANESTHETIC: none  DESCRIPTION OF PROCEDURE: After the risks benefits and alternatives of the procedure were thoroughly explained, informed consent was obtained.  The [id number] endoscope was introduced through the mouth and advanced to the second portion of the duodenum. Without limitations.  The instrument was slowly withdrawn as the mucosa was fully examined.    There was a Schatzki's ring above a small hiatal hernia (1cm).  The ring was dilated with a CRE TTS balloon held inflated to 20mm for 1 minute.  There was usual minor mucosal tear and self limited oozing of blood following dilation.  The examination was otherwise normal. Retroflexed views revealed no abnormalities.     The scope was then withdrawn from the patient and the procedure completed. COMPLICATIONS: There were no complications.  ENDOSCOPIC IMPRESSION: There was a Schatzki's ring above a small hiatal hernia (1cm).  The ring was dilated with a CRE TTS balloon held inflated to 20mm for 1 minute.  There was usual minor mucosal tear and self limited oozing of blood following dilation.  The examination was otherwise normal.   RECOMMENDATIONS: You should notice improvement in your swallowing.  Please call if the swallowing is not better or if it becomes troublesome again in the future.   eSigned:  Rachael Fee, MD 03/02/2013 2:47 PM

## 2013-03-03 ENCOUNTER — Telehealth: Payer: Self-pay | Admitting: *Deleted

## 2013-03-03 NOTE — Telephone Encounter (Signed)
  Follow up Call-  Call back number 03/02/2013 04/15/2011  Post procedure Call Back phone  # [phone].O.K. TO LEAVE MESSAGE  Permission to leave phone message Yes -     Patient questions:  Do you have a fever, pain , or abdominal swelling? no Pain Score  0 *  Have you tolerated food without any problems? yes  Have you been able to return to your normal activities? yes  Do you have any questions about your discharge instructions: Diet   no Medications  no Follow up visit  no  Do you have questions or concerns about your Care? no  Actions: * If pain score is 4 or above: No action needed, pain <4.

## 2013-03-19 ENCOUNTER — Encounter: Payer: BC Managed Care – PPO | Admitting: Women's Health

## 2013-04-09 ENCOUNTER — Ambulatory Visit (INDEPENDENT_AMBULATORY_CARE_PROVIDER_SITE_OTHER): Payer: BC Managed Care – PPO | Admitting: Women's Health

## 2013-04-09 ENCOUNTER — Encounter: Payer: Self-pay | Admitting: Women's Health

## 2013-04-09 ENCOUNTER — Other Ambulatory Visit (HOSPITAL_COMMUNITY)
Admission: RE | Admit: 2013-04-09 | Discharge: 2013-04-09 | Disposition: A | Discharge status: Home / Self Care | Payer: BC Managed Care – PPO | Source: Ambulatory Visit | Attending: Gynecology | Admitting: Gynecology

## 2013-04-09 ENCOUNTER — Other Ambulatory Visit: Payer: Self-pay | Admitting: Women's Health

## 2013-04-09 VITALS — BP 122/78 | Ht 62.0 in | Wt 169.2 lb

## 2013-04-09 DIAGNOSIS — E039 Hypothyroidism, unspecified: Secondary | ICD-10-CM

## 2013-04-09 DIAGNOSIS — Z01419 Encounter for gynecological examination (general) (routine) without abnormal findings: Secondary | ICD-10-CM

## 2013-04-09 DIAGNOSIS — F411 Generalized anxiety disorder: Secondary | ICD-10-CM

## 2013-04-09 DIAGNOSIS — Z833 Family history of diabetes mellitus: Secondary | ICD-10-CM

## 2013-04-09 DIAGNOSIS — Z13220 Encounter for screening for lipoid disorders: Secondary | ICD-10-CM

## 2013-04-09 LAB — URINALYSIS W MICROSCOPIC + REFLEX CULTURE
Bilirubin Urine: NEGATIVE
Crystals: NONE SEEN
Glucose, UA: NEGATIVE mg/dL
Leukocytes, UA: NEGATIVE
Protein, ur: NEGATIVE mg/dL
Specific Gravity, Urine: 1.019 (ref 1.005–1.030)
Squamous Epithelial / LPF: NONE SEEN
Urobilinogen, UA: 0.2 mg/dL (ref 0.0–1.0)

## 2013-04-09 LAB — LIPID PANEL
Cholesterol: 201 mg/dL — ABNORMAL HIGH (ref 0–200)
Total CHOL/HDL Ratio: 5.3 Ratio

## 2013-04-09 LAB — CBC WITH DIFFERENTIAL/PLATELET
Basophils Absolute: 0 10*3/uL (ref 0.0–0.1)
Lymphocytes Relative: 37 % (ref 12–46)
Lymphs Abs: 1.7 10*3/uL (ref 0.7–4.0)
MCV: 89.4 fL (ref 78.0–100.0)
Neutro Abs: 2.3 10*3/uL (ref 1.7–7.7)
Neutrophils Relative %: 49 % (ref 43–77)
Platelets: 195 10*3/uL (ref 150–400)
RBC: 4.7 MIL/uL (ref 3.87–5.11)
RDW: 13.1 % (ref 11.5–15.5)
WBC: 4.6 10*3/uL (ref 4.0–10.5)

## 2013-04-09 LAB — COMPREHENSIVE METABOLIC PANEL
ALT: 27 U/L (ref 0–35)
AST: 24 U/L (ref 0–37)
CO2: 28 mEq/L (ref 19–32)
Calcium: 9.1 mg/dL (ref 8.4–10.5)
Chloride: 102 mEq/L (ref 96–112)
Creat: 0.77 mg/dL (ref 0.50–1.10)
Potassium: 4.1 mEq/L (ref 3.5–5.3)
Sodium: 138 mEq/L (ref 135–145)
Total Protein: 7.2 g/dL (ref 6.0–8.3)

## 2013-04-09 MED ORDER — BETAMETHASONE VALERATE 0.1 % EX OINT: 1.0000 "application " | TOPICAL_OINTMENT | Freq: Two times a day (BID) | CUTANEOUS | Status: DC

## 2013-04-09 MED ORDER — SERTRALINE HCL 50 MG PO TABS: 50.0000 mg | ORAL_TABLET | Freq: Every day | ORAL | Status: DC

## 2013-04-09 MED ORDER — LEVOTHYROXINE SODIUM 100 MCG PO TABS: 100.0000 ug | ORAL_TABLET | Freq: Every day | ORAL | Status: DC

## 2013-04-09 NOTE — Patient Instructions (Signed)

## 2013-04-09 NOTE — Progress Notes (Signed)
Hannah Hanson 1960/09/07 [id number]    History:    The patient presents for annual exam.  Monthly cycles/BTL. History of normal Paps and mammograms. Hypertension per primary care hypothyroid, anxiety and depression stable on Zoloft. Negative colonoscopy 2012. Dilated esophagus 03/02/2013 which corrected swallowing problem. HSV with rare outbreaks. History of gestational diabetes.   Past medical history, past surgical history, family history and social history were all reviewed and documented in the EPIC chart. Works part-time at [employer]. 3 children in their 23s all doing well. Sonohysterogram 2012 submucosal fibroid. Mother hypertension diet.   ROS:  A  ROS was performed and pertinent positives and negatives are included in the history.  Exam:  Filed Vitals:   04/09/13 0902  BP: 122/78    General appearance:  Normal Head/Neck:  Normal, without cervical or supraclavicular adenopathy. Thyroid:  Symmetrical, normal in size, without palpable masses or nodularity. Respiratory  Effort:  Normal  Auscultation:  Clear without wheezing or rhonchi Cardiovascular  Auscultation:  Regular rate, without rubs, murmurs or gallops  Edema/varicosities:  Not grossly evident Abdominal  Soft,nontender, without masses, guarding or rebound.  Liver/spleen:  No organomegaly noted  Hernia:  None appreciated  Skin  Inspection:  Grossly normal  Palpation:  Grossly normal Neurologic/psychiatric  Orientation:  Normal with appropriate conversation.  Mood/affect:  Normal  Genitourinary    Breasts: Examined lying and sitting.     Right: Without masses, retractions, discharge or axillary adenopathy.     Left: Without masses, retractions, discharge or axillary adenopathy.   Inguinal/mons:  Normal without inguinal adenopathy  External genitalia:  Normal  BUS/Urethra/Skene's glands:  Normal  Bladder:  Normal  Vagina:  Normal  Cervix:  Normal  Uterus:   normal in size, shape and contour.  Midline and  mobile  Adnexa/parametria:     Rt: Without masses or tenderness.   Lt: Without masses or tenderness.  Anus and perineum: Normal  Digital rectal exam: Normal sphincter tone without palpated masses or tenderness  Assessment/Plan:  52 y.o. MWF G3P3 for annual exam with no complaints.  Hypertension/primary care manages Hypothyroid on Synthroid 100 Anxiety/depression stable on Zoloft Monthly cycle/BTL  Plan: SBE's, continue annual mammogram, calcium rich diet, vitamin D 2000 daily encouraged. Menopause reviewed. Reviewed importance of increasing regular exercise and decreasing calories for weight loss. Zoloft 50 mg by mouth daily prescription, proper use given and reviewed. Synthroid 100 by mouth daily prescription, proper use given and reviewed. CBC, comprehensive metabolic panel, lipid panel, TSH, UA, Pap. Pap normal 2012, new screening guidelines reviewed. Instructed to print lab results and take to primary care.    Harrington Challenger [facility], 9:50 AM 04/09/2013

## 2013-04-09 NOTE — Addendum Note (Signed)
Addended by: Richardson Chiquito on: 04/09/2013 11:18 AM   Modules accepted: Orders

## 2014-01-25 ENCOUNTER — Encounter: Payer: Self-pay | Admitting: Women's Health

## 2014-01-25 ENCOUNTER — Ambulatory Visit (INDEPENDENT_AMBULATORY_CARE_PROVIDER_SITE_OTHER): Payer: BC Managed Care – PPO | Admitting: Women's Health

## 2014-01-25 DIAGNOSIS — N898 Other specified noninflammatory disorders of vagina: Secondary | ICD-10-CM

## 2014-01-25 LAB — WET PREP FOR TRICH, YEAST, CLUE
CLUE CELLS WET PREP: NONE SEEN
Trich, Wet Prep: NONE SEEN
Yeast Wet Prep HPF POC: NONE SEEN

## 2014-01-25 NOTE — Progress Notes (Signed)
Patient ID: Brandalyn Harting, female   DOB: May 12, 1960, 53 y.o.   MRN: [id number] Presents with complaint of vaginal discharge and left labial irritation for 2 days.  Denies UTI symptoms, fever or abd pain.  Denies HSV history.  Cycles irregular for past 6 months, LMP 12-04-2013, BTL.  Exam:  Appears well, external genitalia, left labia 1-2 cm dry superficial skin irritation, speculum exam, scant white discharge with no visible erythema or odor.  Wet prep negative.  Bimanual, no CMT, adnexal tenderness.  Left labial irritation  Plan: Neosporin twice daily, call if persists after one week. Check FSH at annual exam in Dec.

## 2014-03-07 ENCOUNTER — Encounter: Payer: Self-pay | Admitting: Women's Health

## 2014-04-12 ENCOUNTER — Encounter: Payer: BC Managed Care – PPO | Admitting: Women's Health

## 2014-04-23 ENCOUNTER — Other Ambulatory Visit: Payer: Self-pay | Admitting: Women's Health

## 2014-04-26 ENCOUNTER — Other Ambulatory Visit: Payer: Self-pay | Admitting: Women's Health

## 2014-05-11 ENCOUNTER — Encounter: Payer: Self-pay | Admitting: Women's Health

## 2014-05-11 ENCOUNTER — Ambulatory Visit (INDEPENDENT_AMBULATORY_CARE_PROVIDER_SITE_OTHER): Payer: BLUE CROSS/BLUE SHIELD | Admitting: Women's Health

## 2014-05-11 VITALS — BP 128/80 | Ht 61.0 in | Wt 161.0 lb

## 2014-05-11 DIAGNOSIS — Z01419 Encounter for gynecological examination (general) (routine) without abnormal findings: Secondary | ICD-10-CM

## 2014-05-11 DIAGNOSIS — E038 Other specified hypothyroidism: Secondary | ICD-10-CM

## 2014-05-11 DIAGNOSIS — F4322 Adjustment disorder with anxiety: Secondary | ICD-10-CM

## 2014-05-11 MED ORDER — SERTRALINE HCL 50 MG PO TABS: 50.0000 mg | ORAL_TABLET | Freq: Every day | ORAL | Status: DC

## 2014-05-11 MED ORDER — LEVOTHYROXINE SODIUM 100 MCG PO TABS: 100.0000 ug | ORAL_TABLET | Freq: Every day | ORAL | Status: DC

## 2014-05-11 NOTE — Patient Instructions (Signed)

## 2014-05-11 NOTE — Progress Notes (Signed)
Donnita Farina April 13, 1961 [id number]    History:    Presents for annual exam.  Monthly 3-4 day cycle lighter but continues monthly/BTL. Normal Pap and mammogram history. History of HSV rare outbreaks. Hypertension and hypothyroidism managed by primary care. Anxiety/depression stable on Zoloft. 2012 benign colonoscopy. History of GDM.   Past medical history, past surgical history, family history and social history were all reviewed and documented in the EPIC chart. Homemaker, part-time church activities. Mother hypertension. 3 children all have  anxiety.  ROS:  A ROS was performed and pertinent positives and negatives are included.  Exam:  Filed Vitals:   05/11/14 1056  BP: 128/80    General appearance:  Normal Thyroid:  Symmetrical, normal in size, without palpable masses or nodularity. Respiratory  Auscultation:  Clear without wheezing or rhonchi Cardiovascular  Auscultation:  Regular rate, without rubs, murmurs or gallops  Edema/varicosities:  Not grossly evident Abdominal  Soft,nontender, without masses, guarding or rebound.  Liver/spleen:  No organomegaly noted  Hernia:  None appreciated  Skin  Inspection:  Grossly normal   Breasts: Examined lying and sitting.     Right: Without masses, retractions, discharge or axillary adenopathy.     Left: Without masses, retractions, discharge or axillary adenopathy. Gentitourinary   Inguinal/mons:  Normal without inguinal adenopathy  External genitalia:  Normal  BUS/Urethra/Skene's glands:  Normal  Vagina:  Normal  Cervix:  Normal  Uterus:   normal in size, shape and contour.  Midline and mobile  Adnexa/parametria:     Rt: Without masses or tenderness.   Lt: Without masses or tenderness.  Anus and perineum: Normal  Digital rectal exam: Normal sphincter tone without palpated masses or tenderness  Assessment/Plan:  54 y.o. MWF G3 P3  for annual exam with no complaints.  Light monthly cycle/BTL Hypertension/hypothyroidism primary  care manages labs and meds Anxiety/depression stable on Zoloft  Plan: Zoloft 50 mg by mouth daily prescription, proper use given and reviewed, counseling as needed. Reviewed importance of regular exercise. SBE's, continue annual mammogram, 3-D tomography reviewed and encouraged history of dense breasts, calcium rich diet, vitamin D 1000 daily encouraged. Menopause reviewed denies symptoms. Pap normal 2014, new screening guidelines reviewed.    [place], 1:05 PM 05/11/2014

## 2014-05-12 LAB — URINALYSIS W MICROSCOPIC + REFLEX CULTURE
BILIRUBIN URINE: NEGATIVE
Bacteria, UA: NONE SEEN
CRYSTALS: NONE SEEN
Casts: NONE SEEN
Glucose, UA: NEGATIVE mg/dL
Hgb urine dipstick: NEGATIVE
Ketones, ur: NEGATIVE mg/dL
LEUKOCYTES UA: NEGATIVE
Nitrite: NEGATIVE
PROTEIN: NEGATIVE mg/dL
SQUAMOUS EPITHELIAL / LPF: NONE SEEN
Specific Gravity, Urine: 1.014 (ref 1.005–1.030)
UROBILINOGEN UA: 0.2 mg/dL (ref 0.0–1.0)
pH: 6.5 (ref 5.0–8.0)

## 2014-05-20 ENCOUNTER — Other Ambulatory Visit: Payer: Self-pay | Admitting: Family Medicine

## 2014-05-20 DIAGNOSIS — N951 Menopausal and female climacteric states: Secondary | ICD-10-CM

## 2014-05-20 DIAGNOSIS — Z1231 Encounter for screening mammogram for malignant neoplasm of breast: Secondary | ICD-10-CM

## 2014-05-27 ENCOUNTER — Ambulatory Visit: Payer: Self-pay

## 2014-05-27 ENCOUNTER — Other Ambulatory Visit: Payer: Self-pay

## 2014-06-02 ENCOUNTER — Ambulatory Visit
Admission: RE | Admit: 2014-06-02 | Discharge: 2014-06-02 | Disposition: A | Discharge status: Home / Self Care | Payer: BLUE CROSS/BLUE SHIELD | Source: Ambulatory Visit | Attending: Family Medicine | Admitting: Family Medicine

## 2014-06-02 DIAGNOSIS — Z1231 Encounter for screening mammogram for malignant neoplasm of breast: Secondary | ICD-10-CM

## 2014-06-02 DIAGNOSIS — N951 Menopausal and female climacteric states: Secondary | ICD-10-CM

## 2014-08-24 ENCOUNTER — Other Ambulatory Visit: Payer: Self-pay | Admitting: Women's Health

## 2014-08-24 NOTE — Telephone Encounter (Signed)
Okay to call in Synthroid 100 g by mouth daily with refills through January 2017.

## 2014-09-22 ENCOUNTER — Other Ambulatory Visit: Payer: Self-pay | Admitting: Women's Health

## 2014-11-14 ENCOUNTER — Ambulatory Visit
Payer: BLUE CROSS/BLUE SHIELD | Attending: Family Medicine | Admitting: Rehabilitative and Restorative Service Providers"

## 2014-11-14 DIAGNOSIS — R42 Dizziness and giddiness: Secondary | ICD-10-CM | POA: Diagnosis not present

## 2014-11-14 DIAGNOSIS — R269 Unspecified abnormalities of gait and mobility: Secondary | ICD-10-CM | POA: Diagnosis present

## 2014-11-14 NOTE — Patient Instructions (Addendum)
   Gaze Stabilization: Tip Card 1.Target must remain in focus, not blurry, and appear stationary while head is in motion. 2.Perform exercises with small head movements (45 to either side of midline). 3.Increase speed of head motion so long as target is in focus. 4.If you wear eyeglasses, be sure you can see target through lens (therapist will give specific instructions for bifocal / progressive lenses). 5.These exercises may provoke dizziness or nausea. Work through these symptoms. If too dizzy, slow head movement slightly. Rest between each exercise. 6.Exercises demand concentration; avoid distractions. 7.For safety, perform standing exercises close to a counter, wall, corner, or next to someone.  Copyright  VHI. All rights reserved.  Gaze Stabilization: Standing Feet Apart   Feet shoulder width apart, keeping eyes on target on wall 3 feet away, tilt head down slightly and move head side to side for 30 seconds. Repeat while moving head up and down for 30 seconds. Do 2 sessions per day.   Copyright  VHI. All rights reserved.    Keep a journal (notes) about any episodes of vertigo describing: 1) how long it lasts 2) what brings on symptoms 3) what makes symptoms decrease 4) other symptoms associated with vertigo (nausea, headache, light sensitivity, imbalance, hearing changes, etc)

## 2014-11-15 NOTE — Therapy (Signed)
[facility] [address] [place], [place], [address] Phone: [phone]   Fax:  [phone]  Physical Therapy Evaluation  Patient Details  Name: Hannah Hanson MRN: [id number] Date of Birth: 07-01-1960 Referring Provider:  Leonides Sake, MD  Encounter Date: 11/14/2014      PT End of Session - 11/15/14 0801    Visit Number 1   Number of Visits 4   Date for PT Re-Evaluation 12/15/14   Authorization Type BCBS 30 visit limit   PT Start Time 1020   PT Stop Time 1105   PT Time Calculation (min) 45 min   Activity Tolerance Patient tolerated treatment well   Behavior During Therapy [facility] for tasks assessed/performed      Past Medical History  Diagnosis Date  . STD (sexually transmitted disease)     HSV  . Hypertension   . Thyroid disease     Hypothyroid-Nodule    Past Surgical History  Procedure Laterality Date  . Tubal ligation    . Foot surgery      left  . Cesarean section  1985, [id number]    There were no vitals filed for this visit.  Visit Diagnosis:  Dizziness and giddiness  Abnormality of gait      Subjective Assessment - 11/14/14 1025    Subjective The patient reports a severe bout of vertig in 05/2014 upon waking described as room spinning sensation lasting 3 days.  She remained in bed x days.  Her symptoms gradually improved after January taking 2-3 weeks to return to normal.  She had a second bout of dizziness beginning a month ago noted upon waking lasting x 1.5 days.  The patient had to remain in bed.  She awoke 2 weeks ago in the morning with dizziness and went back to bed and slept x 4 hours and she was able to get up.  She reports it takes several days to return to normal.  Baseline dizziness is <1/10.  "I never feel like I'm totally focused."  She does not note hearing changes.  During bouts of dizziness, she holds onto walls and furniture to walk.   Pertinent History Negative h/o migraines,  positive for headaches (sinus related) noted as pressure with light sensitivity.  Allergy medication began in the fall.    Patient Stated Goals help prevent episodes from returning.     Currently in Pain? No/denies            [facility] PT Assessment - 11/14/14 1034    Assessment   Medical Diagnosis --  vertigo   Onset Date/Surgical Date --  January 2016   Prior Therapy --  none   Balance Screen   Has the patient fallen in the past 6 months No   Has the patient had a decrease in activity level because of a fear of falling?  No   Is the patient reluctant to leave their home because of a fear of falling?  No   Home Environment   Living Environment Private residence   Living Arrangements Spouse/significant other   Observation/Other Assessments   Focus on Therapeutic Outcomes (FOTO)  52%            Vestibular Assessment - 11/14/14 1035    Symptom Behavior   Type of Dizziness Spinning   Frequency of Dizziness --  intermittent   Duration of Dizziness --  days   Aggravating Factors Spontaneous onset   Relieving Factors Lying supine;Head stationary   Occulomotor Exam  Occulomotor Alignment Normal   Spontaneous Absent   Gaze-induced Absent   Smooth Pursuits Intact   Saccades Intact   Vestibulo-Occular Reflex   VOR 1 Head Only (x 1 viewing) --  10 reps without symptoms at self regulated pace   Comment head thrust positive ot the right side   Positional Testing   Dix-Hallpike Dix-Hallpike Right;Dix-Hallpike Left   Sidelying Test Sidelying Right;Sidelying Left   Horizontal Canal Testing Horizontal Canal Right;Horizontal Canal Left   Dix-Hallpike Right   Dix-Hallpike Right Duration --  none   Dix-Hallpike Right Symptoms No nystagmus   Dix-Hallpike Left   Dix-Hallpike Left Symptoms No nystagmus   Sidelying Right   Sidelying Right Symptoms No nystagmus   Sidelying Left   Sidelying Left Symptoms No nystagmus   Horizontal Canal Right   Horizontal Canal Right Symptoms  Normal   Horizontal Canal Left   Horizontal Canal Left Symptoms Normal      NEUROMUSCULAR RE-EDUCATION: Gaze adaptation x 1 viewing in standing      PT Education - 11/15/14 0800    Education provided Yes   Education Details HEP: gaze x 1 viewing   Person(s) Educated Patient   Methods Explanation;Demonstration;Handout   Comprehension Verbalized understanding;Returned demonstration             PT Long Term Goals - 11/15/14 0801    PT LONG TERM GOAL #1   Title The patient will return demo HEP for gaze adaptation.    Baseline Target date 12/15/2014   Time 4   Period Weeks   PT LONG TERM GOAL #2   Title The patient will improve DHI from 52% to < or equal to 40%.   Baseline Target date 12/15/2014   Time 4   Period Weeks               Plan - 11/15/14 1158    Clinical Impression Statement The patient is a 54 yo female presenting to PT with intermittent h/o vertigo with residual mild symptoms.  At today's visit, the primary deficit noted was related to VOR/gaze fixation.  PT recommended HEP and f/u for one more session if any further episodes of vertigo experienced.   Pt will benefit from skilled therapeutic intervention in order to improve on the following deficits Dizziness   Rehab Potential Good   PT Frequency 1x / week   PT Duration 4 weeks   PT Treatment/Interventions Vestibular;Neuromuscular re-education;Patient/family education;Therapeutic exercise;Gait training   PT Next Visit Plan Check gaze; re-assess as needed if patient experiencing more acute episode of vertigo.   Consulted and Agree with Plan of Care Patient         Problem List Patient Active Problem List   Diagnosis Date Noted  . Anxiety and depression 03/18/2012  . Hypertension   . Thyroid disease   Thank you for the referral of this patient.   Luther, PT 11/15/2014, 12:01 PM  [facility] [address] [place],  [place], [address] Phone: [phone]   Fax:  [phone]

## 2014-11-24 ENCOUNTER — Ambulatory Visit: Payer: BLUE CROSS/BLUE SHIELD | Admitting: Rehabilitative and Restorative Service Providers"

## 2014-11-29 ENCOUNTER — Ambulatory Visit: Payer: BLUE CROSS/BLUE SHIELD | Admitting: Rehabilitative and Restorative Service Providers"

## 2014-12-05 ENCOUNTER — Encounter: Payer: Self-pay | Admitting: Rehabilitative and Restorative Service Providers"

## 2014-12-05 NOTE — Therapy (Signed)
[facility] [address] [place], [place], [address] Phone: [phone]   Fax:  [phone]  Patient Details  Name: Hannah Hanson MRN: [id number] Date of Birth: 08-21-1960 Referring Provider:  No ref. provider found  Encounter Date: 11/14/2014  PHYSICAL THERAPY DISCHARGE SUMMARY  Visits from Start of Care: eval only  Current functional level related to goals / functional outcomes:     PT Long Term Goals - 11/15/14 0801    PT LONG TERM GOAL #1   Title The patient will return demo HEP for gaze adaptation.    Baseline Target date 12/15/2014   Time 4   Period Weeks   PT LONG TERM GOAL #2   Title The patient will improve DHI from 52% to < or equal to 40%.   Baseline Target date 12/15/2014   Time 4   Period Weeks     *Goals not formally reassessed, however patient cancelled remaining visit reporting resolution of symptoms.   Remaining deficits: HEP.   Education / Equipment: HEP.  Plan: Patient agrees to discharge.  Patient goals were met.per patient phone call. Patient is being discharged due to meeting the stated rehab goals.  ?????        Caycee Wanat, PT 12/05/2014, 8:26 AM  [facility] [address] [place], [place], [address] Phone: [phone]   Fax:  [phone]

## 2015-05-15 ENCOUNTER — Other Ambulatory Visit: Payer: Self-pay

## 2015-05-15 DIAGNOSIS — Z1231 Encounter for screening mammogram for malignant neoplasm of breast: Secondary | ICD-10-CM

## 2015-05-18 ENCOUNTER — Encounter: Payer: Self-pay | Admitting: Women's Health

## 2015-05-18 ENCOUNTER — Other Ambulatory Visit (HOSPITAL_COMMUNITY)
Admission: RE | Admit: 2015-05-18 | Discharge: 2015-05-18 | Disposition: A | Discharge status: Home / Self Care | Payer: BLUE CROSS/BLUE SHIELD | Source: Ambulatory Visit | Attending: Women's Health | Admitting: Women's Health

## 2015-05-18 ENCOUNTER — Ambulatory Visit (INDEPENDENT_AMBULATORY_CARE_PROVIDER_SITE_OTHER): Payer: BLUE CROSS/BLUE SHIELD | Admitting: Women's Health

## 2015-05-18 VITALS — BP 126/80 | Ht 62.0 in | Wt 166.0 lb

## 2015-05-18 DIAGNOSIS — Z1151 Encounter for screening for human papillomavirus (HPV): Secondary | ICD-10-CM | POA: Insufficient documentation

## 2015-05-18 DIAGNOSIS — Z01419 Encounter for gynecological examination (general) (routine) without abnormal findings: Secondary | ICD-10-CM | POA: Diagnosis not present

## 2015-05-18 DIAGNOSIS — F4322 Adjustment disorder with anxiety: Secondary | ICD-10-CM

## 2015-05-18 DIAGNOSIS — Z13220 Encounter for screening for lipoid disorders: Secondary | ICD-10-CM

## 2015-05-18 DIAGNOSIS — E038 Other specified hypothyroidism: Secondary | ICD-10-CM

## 2015-05-18 LAB — CBC WITH DIFFERENTIAL/PLATELET
BASOS ABS: 0 10*3/uL (ref 0.0–0.1)
Basophils Relative: 0 % (ref 0–1)
EOS ABS: 0.3 10*3/uL (ref 0.0–0.7)
EOS PCT: 4 % (ref 0–5)
HCT: 44.4 % (ref 36.0–46.0)
Hemoglobin: 14.7 g/dL (ref 12.0–15.0)
LYMPHS PCT: 26 % (ref 12–46)
Lymphs Abs: 1.8 10*3/uL (ref 0.7–4.0)
MCH: 29.8 pg (ref 26.0–34.0)
MCHC: 33.1 g/dL (ref 30.0–36.0)
MCV: 90.1 fL (ref 78.0–100.0)
MPV: 8.7 fL (ref 8.6–12.4)
Monocytes Absolute: 0.6 10*3/uL (ref 0.1–1.0)
Monocytes Relative: 8 % (ref 3–12)
NEUTROS PCT: 62 % (ref 43–77)
Neutro Abs: 4.3 10*3/uL (ref 1.7–7.7)
PLATELETS: 204 10*3/uL (ref 150–400)
RBC: 4.93 MIL/uL (ref 3.87–5.11)
RDW: 13.4 % (ref 11.5–15.5)
WBC: 7 10*3/uL (ref 4.0–10.5)

## 2015-05-18 LAB — LIPID PANEL
CHOL/HDL RATIO: 4.9 ratio (ref <=5.0)
CHOLESTEROL: 168 mg/dL (ref 125–200)
HDL: 34 mg/dL — ABNORMAL LOW (ref >=46)
LDL Cholesterol: 115 mg/dL (ref <130)
Triglycerides: 96 mg/dL (ref <150)
VLDL: 19 mg/dL (ref <30)

## 2015-05-18 LAB — COMPREHENSIVE METABOLIC PANEL
ALK PHOS: 73 U/L (ref 33–130)
ALT: 30 U/L — AB (ref 6–29)
AST: 25 U/L (ref 10–35)
Albumin: 4 g/dL (ref 3.6–5.1)
BILIRUBIN TOTAL: 0.7 mg/dL (ref 0.2–1.2)
BUN: 11 mg/dL (ref 7–25)
CO2: 28 mmol/L (ref 20–31)
CREATININE: 0.73 mg/dL (ref 0.50–1.05)
Calcium: 8.9 mg/dL (ref 8.6–10.4)
Chloride: 101 mmol/L (ref 98–110)
GLUCOSE: 89 mg/dL (ref 65–99)
Potassium: 4.3 mmol/L (ref 3.5–5.3)
Sodium: 137 mmol/L (ref 135–146)
TOTAL PROTEIN: 7 g/dL (ref 6.1–8.1)

## 2015-05-18 LAB — T3 UPTAKE: T3 UPTAKE: 30 % (ref 22–35)

## 2015-05-18 LAB — T4: T4, Total: 7.9 ug/dL (ref 4.5–12.0)

## 2015-05-18 MED ORDER — SERTRALINE HCL 50 MG PO TABS: 50.0000 mg | ORAL_TABLET | Freq: Every day | ORAL | Status: DC

## 2015-05-18 NOTE — Patient Instructions (Signed)

## 2015-05-18 NOTE — Addendum Note (Signed)
Addended by: Thamas Jaegers on: 05/18/2015 08:57 AM   Modules accepted: Orders

## 2015-05-18 NOTE — Progress Notes (Signed)
Salaya Lenington [birth year] [id number]    History:    Presents for annual exam.  Irregular cycles this past year had gone 6 months with no cycle and had a light cycle in December. Occasional hot flushes. BTL. History of normal Paps and mammograms. Hypertension and hypothyroidism managed by primary care. Anxiety and depression stable on Zoloft. 2012 benign colon polyp. History of GDM.  Past medical history, past surgical history, family history and social history were all reviewed and documented in the EPIC chart. Works from home. Has 3 children all doing okay.  ROS:  A ROS was performed and pertinent positives and negatives are included.  Exam:  Filed Vitals:   05/18/15 0806  BP: 126/80    General appearance:  Normal Thyroid:  Symmetrical, normal in size, without palpable masses or nodularity. Respiratory  Auscultation:  Clear without wheezing or rhonchi Cardiovascular  Auscultation:  Regular rate, without rubs, murmurs or gallops  Edema/varicosities:  Not grossly evident Abdominal  Soft,nontender, without masses, guarding or rebound.  Liver/spleen:  No organomegaly noted  Hernia:  None appreciated  Skin  Inspection:  Grossly normal   Breasts: Examined lying and sitting.     Right: Without masses, retractions, discharge or axillary adenopathy.     Left: Without masses, retractions, discharge or axillary adenopathy. Gentitourinary   Inguinal/mons:  Normal without inguinal adenopathy  External genitalia:  Normal  BUS/Urethra/Skene's glands:  Normal  Vagina:  Normal  Cervix:  Normal  Uterus:  normal in size, shape and contour.  Midline and mobile  Adnexa/parametria:     Rt: Without masses or tenderness.   Lt: Without masses or tenderness.  Anus and perineum: Normal  Digital rectal exam: Normal sphincter tone without palpated masses or tenderness  Assessment/Plan:  55 y.o. MWF G3 P3 for annual exam with no complaints.  Perimenopausal/BTL Hypothyroid /hypertension managed by  primary care Anxiety/depression stable on Zoloft  Plan: Requested labs, CBC, CMP, lipid panel, TSH, T3, T4, vitamin D, UA, Pap with HR HPV typing, new screening guidelines reviewed. SBE's, continue annual screening mammogram 3-D tomography encouraged history of dense breasts. Encouraged regular exercise, calcium rich diet, leisure activities. Zoloft 50 mg by mouth daily prescription, proper use given and reviewed. Colonoscopy per GI  recommendation. Menopause reviewed.    [place], 8:30 AM 05/18/2015

## 2015-05-19 ENCOUNTER — Other Ambulatory Visit: Payer: Self-pay | Admitting: Women's Health

## 2015-05-19 LAB — URINALYSIS W MICROSCOPIC + REFLEX CULTURE
BACTERIA UA: NONE SEEN [HPF]
Bilirubin Urine: NEGATIVE
CRYSTALS: NONE SEEN [HPF]
Casts: NONE SEEN [LPF]
GLUCOSE, UA: NEGATIVE
HGB URINE DIPSTICK: NEGATIVE
Ketones, ur: NEGATIVE
Leukocytes, UA: NEGATIVE
Nitrite: NEGATIVE
PH: 5.5 (ref 5.0–8.0)
Protein, ur: NEGATIVE
Specific Gravity, Urine: 1.023 (ref 1.001–1.035)
WBC, UA: NONE SEEN WBC/HPF (ref <=5)
YEAST: NONE SEEN [HPF]

## 2015-05-19 LAB — TSH: TSH: 4.233 u[IU]/mL (ref 0.350–4.500)

## 2015-05-19 LAB — VITAMIN D 25 HYDROXY (VIT D DEFICIENCY, FRACTURES): VIT D 25 HYDROXY: 26 ng/mL — AB (ref 30–100)

## 2015-05-19 LAB — CYTOLOGY - PAP

## 2015-05-19 MED ORDER — VITAMIN D (ERGOCALCIFEROL) 1.25 MG (50000 UNIT) PO CAPS: 50000.0000 [IU] | ORAL_CAPSULE | ORAL | Status: DC

## 2015-05-20 LAB — URINE CULTURE
COLONY COUNT: NO GROWTH
ORGANISM ID, BACTERIA: NO GROWTH

## 2015-06-06 ENCOUNTER — Ambulatory Visit
Admission: RE | Admit: 2015-06-06 | Discharge: 2015-06-06 | Disposition: A | Discharge status: Home / Self Care | Payer: BLUE CROSS/BLUE SHIELD | Source: Ambulatory Visit

## 2015-06-06 DIAGNOSIS — Z1231 Encounter for screening mammogram for malignant neoplasm of breast: Secondary | ICD-10-CM

## 2015-06-07 ENCOUNTER — Encounter: Payer: Self-pay | Admitting: Women's Health

## 2016-02-26 ENCOUNTER — Telehealth: Payer: Self-pay

## 2016-02-26 NOTE — Telephone Encounter (Signed)
Wants a ct calcium score test .  Dr. Rockey Situ saw a family member at the hospital and suggested relatives consider this screening.  Please call to talk about scheduling.

## 2016-04-10 ENCOUNTER — Ambulatory Visit (INDEPENDENT_AMBULATORY_CARE_PROVIDER_SITE_OTHER): Payer: 59

## 2016-04-10 ENCOUNTER — Ambulatory Visit (INDEPENDENT_AMBULATORY_CARE_PROVIDER_SITE_OTHER): Payer: 59 | Admitting: Podiatry

## 2016-04-10 ENCOUNTER — Encounter: Payer: Self-pay | Admitting: Podiatry

## 2016-04-10 VITALS — BP 140/80 | HR 73 | Resp 16 | Ht 61.0 in | Wt 160.0 lb

## 2016-04-10 DIAGNOSIS — M722 Plantar fascial fibromatosis: Secondary | ICD-10-CM

## 2016-04-10 DIAGNOSIS — M7661 Achilles tendinitis, right leg: Secondary | ICD-10-CM | POA: Diagnosis not present

## 2016-04-10 MED ORDER — DICLOFENAC SODIUM 75 MG PO TBEC: 75.0000 mg | DELAYED_RELEASE_TABLET | Freq: Two times a day (BID) | ORAL | 2 refills | Status: DC

## 2016-04-10 MED ORDER — TRIAMCINOLONE ACETONIDE 10 MG/ML IJ SUSP
10.0000 mg | Freq: Once | INTRAMUSCULAR | Status: AC
  Administered 2016-04-10: 10 mg

## 2016-04-10 NOTE — Patient Instructions (Signed)

## 2016-04-10 NOTE — Progress Notes (Signed)
   Subjective:    Patient ID: Hannah Hanson, female    DOB: 06-07-1960, 55 y.o.   MRN: [id number]  HPI Chief Complaint  Patient presents with  . Foot Pain    Right foot; back of heel; x4 months      Review of Systems  All other systems reviewed and are negative.      Objective:   Physical Exam        Assessment & Plan:

## 2016-04-11 NOTE — Progress Notes (Signed)
Subjective:     Patient ID: Hannah Hanson, female   DOB: 03-26-1961, 55 y.o.   MRN: [id number]  HPI patient states that she is a 4 month history of pain in the back of the heel and does not remember specific injury but it makes it hard for her to walk and she's tried ice she's tried shoe gear modifications without relief   Review of Systems  All other systems reviewed and are negative.      Objective:   Physical Exam  Constitutional: She is oriented to person, place, and time.  Cardiovascular: Intact distal pulses.   Musculoskeletal: Normal range of motion.  Neurological: She is oriented to person, place, and time.  Skin: Skin is warm.  Nursing note and vitals reviewed.  neurovascular status found to be intact muscle strength is adequate inflammation and pain posterior aspect right heel mostly on the medial side with fluid buildup and no equinus condition noted. 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 I placed her in an air fracture walker to completely immobilize and let the posterior heel rest and I will see back again in 3 week x-rays indicate small spur with no indication of advanced arthritis or stress fracture

## 2016-05-01 ENCOUNTER — Ambulatory Visit: Payer: 59 | Admitting: Podiatry

## 2016-05-09 ENCOUNTER — Ambulatory Visit: Payer: 59 | Admitting: Podiatry

## 2016-05-21 ENCOUNTER — Ambulatory Visit (INDEPENDENT_AMBULATORY_CARE_PROVIDER_SITE_OTHER): Payer: 59 | Admitting: Women's Health

## 2016-05-21 ENCOUNTER — Encounter: Payer: Self-pay | Admitting: Women's Health

## 2016-05-21 VITALS — BP 128/86 | Ht 61.75 in | Wt 163.0 lb

## 2016-05-21 DIAGNOSIS — E038 Other specified hypothyroidism: Secondary | ICD-10-CM | POA: Diagnosis not present

## 2016-05-21 DIAGNOSIS — Z01419 Encounter for gynecological examination (general) (routine) without abnormal findings: Secondary | ICD-10-CM

## 2016-05-21 DIAGNOSIS — F4322 Adjustment disorder with anxiety: Secondary | ICD-10-CM

## 2016-05-21 DIAGNOSIS — I10 Essential (primary) hypertension: Secondary | ICD-10-CM | POA: Diagnosis not present

## 2016-05-21 DIAGNOSIS — Z13220 Encounter for screening for lipoid disorders: Secondary | ICD-10-CM

## 2016-05-21 LAB — CBC WITH DIFFERENTIAL/PLATELET
Basophils Absolute: 45 cells/uL (ref 0–200)
Basophils Relative: 1 %
Eosinophils Absolute: 135 cells/uL (ref 15–500)
Eosinophils Relative: 3 %
HEMATOCRIT: 45.7 % — AB (ref 35.0–45.0)
HEMOGLOBIN: 15.5 g/dL (ref 11.7–15.5)
Lymphocytes Relative: 41 %
Lymphs Abs: 1845 cells/uL (ref 850–3900)
MCH: 30.4 pg (ref 27.0–33.0)
MCHC: 33.9 g/dL (ref 32.0–36.0)
MCV: 89.6 fL (ref 80.0–100.0)
MONO ABS: 360 {cells}/uL (ref 200–950)
MPV: 9.2 fL (ref 7.5–12.5)
Monocytes Relative: 8 %
Neutro Abs: 2115 cells/uL (ref 1500–7800)
Neutrophils Relative %: 47 %
Platelets: 178 10*3/uL (ref 140–400)
RBC: 5.1 MIL/uL (ref 3.80–5.10)
RDW: 13.6 % (ref 11.0–15.0)
WBC: 4.5 10*3/uL (ref 3.8–10.8)

## 2016-05-21 LAB — COMPREHENSIVE METABOLIC PANEL
ALBUMIN: 4.3 g/dL (ref 3.6–5.1)
ALK PHOS: 88 U/L (ref 33–130)
ALT: 21 U/L (ref 6–29)
AST: 19 U/L (ref 10–35)
BILIRUBIN TOTAL: 0.9 mg/dL (ref 0.2–1.2)
BUN: 17 mg/dL (ref 7–25)
CALCIUM: 9.1 mg/dL (ref 8.6–10.4)
CO2: 25 mmol/L (ref 20–31)
Chloride: 104 mmol/L (ref 98–110)
Creat: 0.92 mg/dL (ref 0.50–1.05)
Glucose, Bld: 110 mg/dL — ABNORMAL HIGH (ref 65–99)
Potassium: 4.5 mmol/L (ref 3.5–5.3)
Sodium: 138 mmol/L (ref 135–146)
TOTAL PROTEIN: 7 g/dL (ref 6.1–8.1)

## 2016-05-21 LAB — TSH: TSH: 2.31 mIU/L

## 2016-05-21 LAB — LIPID PANEL
CHOLESTEROL: 189 mg/dL (ref <200)
HDL: 46 mg/dL — AB (ref >50)
LDL Cholesterol: 128 mg/dL — ABNORMAL HIGH (ref <100)
Total CHOL/HDL Ratio: 4.1 Ratio (ref <5.0)
Triglycerides: 74 mg/dL (ref <150)
VLDL: 15 mg/dL (ref <30)

## 2016-05-21 MED ORDER — LEVOTHYROXINE SODIUM 100 MCG PO TABS: 100.0000 ug | ORAL_TABLET | Freq: Every day | ORAL | 4 refills | Status: DC

## 2016-05-21 MED ORDER — SERTRALINE HCL 50 MG PO TABS: 50.0000 mg | ORAL_TABLET | Freq: Every day | ORAL | 4 refills | Status: DC

## 2016-05-21 MED ORDER — LISINOPRIL 10 MG PO TABS: 10.0000 mg | ORAL_TABLET | Freq: Every day | ORAL | 0 refills | Status: AC

## 2016-05-21 NOTE — Patient Instructions (Signed)
DASH Eating Plan DASH stands for "Dietary Approaches to Stop Hypertension." The DASH eating plan is a healthy eating plan that has been shown to reduce high blood pressure (hypertension). Additional health benefits may include reducing the risk of type 2 diabetes mellitus, heart disease, and stroke. The DASH eating plan may also help with weight loss. What do I need to know about the DASH eating plan? For the DASH eating plan, you will follow these general guidelines:  Choose foods with less than 150 milligrams of sodium per serving (as listed on the food label).  Use salt-free seasonings or herbs instead of table salt or sea salt.  Check with your health care provider or pharmacist before using salt substitutes.  Eat lower-sodium products. These are often labeled as "low-sodium" or "no salt added."  Eat fresh foods. Avoid eating a lot of canned foods.  Eat more vegetables, fruits, and low-fat dairy products.  Choose whole grains. Look for the word "whole" as the first word in the ingredient list.  Choose fish and skinless chicken or [place] more often than red meat. Limit fish, poultry, and meat to 6 oz (170 g) each day.  Limit sweets, desserts, sugars, and sugary drinks.  Choose heart-healthy fats.  Eat more home-cooked food and less restaurant, buffet, and fast food.  Limit fried foods.  Do not fry foods. Cook foods using methods such as baking, boiling, grilling, and broiling instead.  When eating at a restaurant, ask that your food be prepared with less salt, or no salt if possible. What foods can I eat? Seek help from a dietitian for individual calorie needs. Grains  Whole grain or whole wheat bread. Brown rice. Whole grain or whole wheat pasta. Quinoa, bulgur, and whole grain cereals. Low-sodium cereals. Corn or whole wheat flour tortillas. Whole grain cornbread. Whole grain crackers. Low-sodium crackers. Vegetables  Fresh or frozen vegetables (raw, steamed, roasted, or  grilled). Low-sodium or reduced-sodium tomato and vegetable juices. Low-sodium or reduced-sodium tomato sauce and paste. Low-sodium or reduced-sodium canned vegetables. Fruits  All fresh, canned (in natural juice), or frozen fruits. Meat and Other Protein Products  Ground beef (85% or leaner), grass-fed beef, or beef trimmed of fat. Skinless chicken or [place]. Ground chicken or [place]. Pork trimmed of fat. All fish and seafood. Eggs. Dried beans, peas, or lentils. Unsalted nuts and seeds. Unsalted canned beans. Dairy  Low-fat dairy products, such as skim or 1% milk, 2% or reduced-fat cheeses, low-fat ricotta or cottage cheese, or plain low-fat yogurt. Low-sodium or reduced-sodium cheeses. Fats and Oils  Tub margarines without trans fats. Light or reduced-fat mayonnaise and salad dressings (reduced sodium). Avocado. Safflower, olive, or canola oils. Natural peanut or almond butter. Other  Unsalted popcorn and pretzels. The items listed above may not be a complete list of recommended foods or beverages. Contact your dietitian for more options.  What foods are not recommended? Grains  White bread. White pasta. White rice. Refined cornbread. Bagels and croissants. Crackers that contain trans fat. Vegetables  Creamed or fried vegetables. Vegetables in a cheese sauce. Regular canned vegetables. Regular canned tomato sauce and paste. Regular tomato and vegetable juices. Fruits  Canned fruit in light or heavy syrup. Fruit juice. Meat and Other Protein Products  Fatty cuts of meat. Ribs, chicken wings, bacon, sausage, bologna, salami, chitterlings, fatback, hot dogs, bratwurst, and packaged luncheon meats. Salted nuts and seeds. Canned beans with salt. Dairy  Whole or 2% milk, cream, half-and-half, and cream cheese. Whole-fat or sweetened yogurt. Full-fat cheeses  or blue cheese. Nondairy creamers and whipped toppings. Processed cheese, cheese spreads, or cheese curds. Condiments  Onion and garlic  salt, seasoned salt, table salt, and sea salt. Canned and packaged gravies. Worcestershire sauce. Tartar sauce. Barbecue sauce. Teriyaki sauce. Soy sauce, including reduced sodium. Steak sauce. Fish sauce. Oyster sauce. Cocktail sauce. Horseradish. Ketchup and mustard. Meat flavorings and tenderizers. Bouillon cubes. Hot sauce. Tabasco sauce. Marinades. Taco seasonings. Relishes. Fats and Oils  Butter, stick margarine, lard, shortening, ghee, and bacon fat. Coconut, palm kernel, or palm oils. Regular salad dressings. Other  Pickles and olives. Salted popcorn and pretzels. The items listed above may not be a complete list of foods and beverages to avoid. Contact your dietitian for more information.  Where can I find more information? National Heart, Lung, and Blood Institute: travelstabloid.com This information is not intended to replace advice given to you by your health care provider. Make sure you discuss any questions you have with your health care provider. Document Released: 04/11/2011 Document Revised: 09/28/2015 Document Reviewed: 02/24/2013 Elsevier Interactive Patient Education  2017 [place] Maintenance for Postmenopausal Women Introduction Menopause is a normal process in which your reproductive ability comes to an end. This process happens gradually over a span of months to years, usually between the ages of 45 and 48. Menopause is complete when you have missed 12 consecutive menstrual periods. It is important to talk with your health care provider about some of the most common conditions that affect postmenopausal women, such as heart disease, cancer, and bone loss (osteoporosis). Adopting a healthy lifestyle and getting preventive care can help to promote your health and wellness. Those actions can also lower your chances of developing some of these common conditions. What should I know about menopause? During menopause, you may experience a  number of symptoms, such as:  Moderate-to-severe hot flashes.  Night sweats.  Decrease in sex drive.  Mood swings.  Headaches.  Tiredness.  Irritability.  Memory problems.  Insomnia. Choosing to treat or not to treat menopausal changes is an individual decision that you make with your health care provider. What should I know about hormone replacement therapy and supplements? Hormone therapy products are effective for treating symptoms that are associated with menopause, such as hot flashes and night sweats. Hormone replacement carries certain risks, especially as you become older. If you are thinking about using estrogen or estrogen with progestin treatments, discuss the benefits and risks with your health care provider. What should I know about heart disease and stroke? Heart disease, heart attack, and stroke become more likely as you age. This may be due, in part, to the hormonal changes that your body experiences during menopause. These can affect how your body processes dietary fats, triglycerides, and cholesterol. Heart attack and stroke are both medical emergencies. There are many things that you can do to help prevent heart disease and stroke:  Have your blood pressure checked at least every 1-2 years. High blood pressure causes heart disease and increases the risk of stroke.  If you are 80-13 years old, ask your health care provider if you should take aspirin to prevent a heart attack or a stroke.  Do not use any tobacco products, including cigarettes, chewing tobacco, or electronic cigarettes. If you need help quitting, ask your health care provider.  It is important to eat a healthy diet and maintain a healthy weight.  Be sure to include plenty of vegetables, fruits, low-fat dairy products, and lean protein.  Avoid eating foods that  are high in solid fats, added sugars, or salt (sodium).  Get regular exercise. This is one of the most important things that you can do  for your health.  Try to exercise for at least 150 minutes each week. The type of exercise that you do should increase your heart rate and make you sweat. This is known as moderate-intensity exercise.  Try to do strengthening exercises at least twice each week. Do these in addition to the moderate-intensity exercise.  Know your numbers.Ask your health care provider to check your cholesterol and your blood glucose. Continue to have your blood tested as directed by your health care provider. What should I know about cancer screening? There are several types of cancer. Take the following steps to reduce your risk and to catch any cancer development as early as possible. Breast Cancer  Practice breast self-awareness.  This means understanding how your breasts normally appear and feel.  It also means doing regular breast self-exams. Let your health care provider know about any changes, no matter how small.  If you are 40 or older, have a clinician do a breast exam (clinical breast exam or CBE) every year. Depending on your age, family history, and medical history, it may be recommended that you also have a yearly breast X-ray (mammogram).  If you have a family history of breast cancer, talk with your health care provider about genetic screening.  If you are at high risk for breast cancer, talk with your health care provider about having an MRI and a mammogram every year.  Breast cancer (BRCA) gene test is recommended for women who have family members with BRCA-related cancers. Results of the assessment will determine the need for genetic counseling and BRCA1 and for BRCA2 testing. BRCA-related cancers include these types:  Breast. This occurs in males or females.  Ovarian.  Tubal. This may also be called fallopian tube cancer.  Cancer of the abdominal or pelvic lining (peritoneal cancer).  Prostate.  Pancreatic. Cervical, Uterine, and Ovarian Cancer  Your health care provider may  recommend that you be screened regularly for cancer of the pelvic organs. These include your ovaries, uterus, and vagina. This screening involves a pelvic exam, which includes checking for microscopic changes to the surface of your cervix (Pap test).  For women ages 21-65, health care providers may recommend a pelvic exam and a Pap test every three years. For women ages 30-65, they may recommend the Pap test and pelvic exam, combined with testing for human papilloma virus (HPV), every five years. Some types of HPV increase your risk of cervical cancer. Testing for HPV may also be done on women of any age who have unclear Pap test results.  Other health care providers may not recommend any screening for nonpregnant women who are considered low risk for pelvic cancer and have no symptoms. Ask your health care provider if a screening pelvic exam is right for you.  If you have had past treatment for cervical cancer or a condition that could lead to cancer, you need Pap tests and screening for cancer for at least 20 years after your treatment. If Pap tests have been discontinued for you, your risk factors (such as having a new sexual partner) need to be reassessed to determine if you should start having screenings again. Some women have medical problems that increase the chance of getting cervical cancer. In these cases, your health care provider may recommend that you have screening and Pap tests more often.  If   you have a family history of uterine cancer or ovarian cancer, talk with your health care provider about genetic screening.  If you have vaginal bleeding after reaching menopause, tell your health care provider.  There are currently no reliable tests available to screen for ovarian cancer. Lung Cancer  Lung cancer screening is recommended for adults 55-80 years old who are at high risk for lung cancer because of a history of smoking. A yearly low-dose CT scan of the lungs is recommended if  you:  Currently smoke.  Have a history of at least 30 pack-years of smoking and you currently smoke or have quit within the past 15 years. A pack-year is smoking an average of one pack of cigarettes per day for one year. Yearly screening should:  Continue until it has been 15 years since you quit.  Stop if you develop a health problem that would prevent you from having lung cancer treatment. Colorectal Cancer  This type of cancer can be detected and can often be prevented.  Routine colorectal cancer screening usually begins at age 50 and continues through age 75.  If you have risk factors for colon cancer, your health care provider may recommend that you be screened at an earlier age.  If you have a family history of colorectal cancer, talk with your health care provider about genetic screening.  Your health care provider may also recommend using home test kits to check for hidden blood in your stool.  A small camera at the end of a tube can be used to examine your colon directly (sigmoidoscopy or colonoscopy). This is done to check for the earliest forms of colorectal cancer.  Direct examination of the colon should be repeated every 5-10 years until age 75. However, if early forms of precancerous polyps or small growths are found or if you have a family history or genetic risk for colorectal cancer, you may need to be screened more often. Skin Cancer  Check your skin from head to toe regularly.  Monitor any moles. Be sure to tell your health care provider:  About any new moles or changes in moles, especially if there is a change in a mole's shape or color.  If you have a mole that is larger than the size of a pencil eraser.  If any of your family members has a history of skin cancer, especially at a young age, talk with your health care provider about genetic screening.  Always use sunscreen. Apply sunscreen liberally and repeatedly throughout the day.  Whenever you are  outside, protect yourself by wearing long sleeves, pants, a wide-brimmed hat, and sunglasses. What should I know about osteoporosis? Osteoporosis is a condition in which bone destruction happens more quickly than new bone creation. After menopause, you may be at an increased risk for osteoporosis. To help prevent osteoporosis or the bone fractures that can happen because of osteoporosis, the following is recommended:  If you are 19-50 years old, get at least 1,000 mg of calcium and at least 600 mg of vitamin D per day.  If you are older than age 50 but younger than age 70, get at least 1,200 mg of calcium and at least 600 mg of vitamin D per day.  If you are older than age 70, get at least 1,200 mg of calcium and at least 800 mg of vitamin D per day. Smoking and excessive alcohol intake increase the risk of osteoporosis. Eat foods that are rich in calcium and vitamin D, and   do weight-bearing exercises several times each week as directed by your health care provider. What should I know about how menopause affects my mental health? Depression may occur at any age, but it is more common as you become older. Common symptoms of depression include:  Low or sad mood.  Changes in sleep patterns.  Changes in appetite or eating patterns.  Feeling an overall lack of motivation or enjoyment of activities that you previously enjoyed.  Frequent crying spells. Talk with your health care provider if you think that you are experiencing depression. What should I know about immunizations? It is important that you get and maintain your immunizations. These include:  Tetanus, diphtheria, and pertussis (Tdap) booster vaccine.  Influenza every year before the flu season begins.  Pneumonia vaccine.  Shingles vaccine. Your health care provider may also recommend other immunizations. This information is not intended to replace advice given to you by your health care provider. Make sure you discuss any  questions you have with your health care provider. Document Released: 06/14/2005 Document Revised: 11/10/2015 Document Reviewed: 01/24/2015  2017 Elsevier

## 2016-05-21 NOTE — Progress Notes (Signed)
Rosenda Horwedel 1961-03-16 [id number]    History:    Presents for annual exam and anxiety about life. Normal Pap and mammogram history.  LMP 2/17/BTL, irregular cycles year prior.  Some hot flashes, low libido and vaginal dryness.  Uses lubricant . Concerned about family, overwhelmed by son moving out and life in general.   2012 get a colonoscopy. Hypertension on medication, hypothyroid on Synthroid, anxiety and depression stable on Zoloft. Hypertension managed by primary care.   Past medical history, past surgical history, family history and social history were all reviewed and documented in the EPIC chart. [id number] diabetes/Anxiety, FHx Mother: Diabetes [place], Father: Heart Disease Sexual activity  Social: Involved with church. 58 year old son struggling with anxiety.  ROS:  A ROS was performed and pertinent positives and negatives are included.  Exam:  Vitals:   05/21/16 0829  BP: 128/86  Weight: 163 lb (73.9 kg)  Height: 5' 1.75" (1.568 m)   Body mass index is 30.05 kg/m.   General appearance:  Normal, weill appearing  Thyroid:  Symmetrical, normal in size, without palpable masses or nodularity. Respiratory  Auscultation:  Clear without wheezing or rhonchi Cardiovascular  Auscultation:  Regular rate, without rubs, murmurs or gallops  Edema/varicosities:  Not grossly evident Abdominal  Soft,nontender, without masses, guarding or rebound.  Liver/spleen:  No organomegaly noted  Hernia:  None appreciated  Skin  Inspection:  Grossly normal   Breasts: Examined lying and sitting.     Right: Without masses, retractions, discharge or axillary adenopathy.     Left: Without masses, retractions, discharge or axillary adenopathy. Gentitourinary   Inguinal/mons:  Normal without inguinal adenopathy  External genitalia:  Normal  BUS/Urethra/Skene's glands:  Normal  Vagina:  Atrophic  Cervix:  Normal  Uterus:  normal in size, shape and contour.  Midline and  mobile  Adnexa/parametria:     Rt: Without masses or tenderness.   Lt: Without masses or tenderness.  Anus and perineum: Normal  Digital rectal exam: Normal sphincter tone without palpated masses or tenderness   Assessment/Plan:  56 y.o. M WF G4 P3  for annual exam.    Vaginal Atrophy Anxiety and Depression on Zoloft Hypertensive (Blood pressure elevated today) on lisinopril LMP 06/2015/BTL Hypothyroidism on Synthroid  Plan:   Reviewed hypertension guidelines and provided information on DASH diet. Explained guidelines for exercises to improve lipids. Emphasized medication compliance with Zoloft 50mg .  Encouraged counseling, physical activity, and interaction with natural supports to address feels of anxiety and depression.  Discussed primay care management and returning to care. Prescibed 30 day supply of Lisinopril 10 mg for continuity care until appointment. Discussed methods of reconnect with spouse, reviewed biological changes to menopause.  Provided guidance on managing changes.  CBC, CMP, lipid panel, Vit D screening, TSH pending, UA, Pap normal with negative HR HPV 2017, new screening guidelines reviewed.Marland Kitchen   Huel Cote [facility], 9:03 AM 05/21/2016

## 2016-05-22 LAB — VITAMIN D 25 HYDROXY (VIT D DEFICIENCY, FRACTURES): VIT D 25 HYDROXY: 46 ng/mL (ref 30–100)

## 2016-05-24 ENCOUNTER — Other Ambulatory Visit: Payer: Self-pay | Admitting: Women's Health

## 2016-05-24 DIAGNOSIS — R739 Hyperglycemia, unspecified: Secondary | ICD-10-CM

## 2016-05-28 ENCOUNTER — Other Ambulatory Visit: Payer: 59

## 2016-05-29 ENCOUNTER — Other Ambulatory Visit: Payer: 59

## 2016-05-29 DIAGNOSIS — R739 Hyperglycemia, unspecified: Secondary | ICD-10-CM

## 2016-05-30 ENCOUNTER — Encounter: Payer: Self-pay | Admitting: Women's Health

## 2016-05-30 LAB — HEMOGLOBIN A1C
Hgb A1c MFr Bld: 5.7 % — ABNORMAL HIGH (ref <5.7)
Mean Plasma Glucose: 117 mg/dL

## 2016-06-05 ENCOUNTER — Encounter: Payer: Self-pay | Admitting: Women's Health

## 2016-09-18 ENCOUNTER — Encounter: Payer: Self-pay | Admitting: Gynecology

## 2016-09-19 ENCOUNTER — Other Ambulatory Visit: Payer: Self-pay | Admitting: Women's Health

## 2016-09-19 DIAGNOSIS — I10 Essential (primary) hypertension: Secondary | ICD-10-CM

## 2016-09-20 NOTE — Telephone Encounter (Signed)
Per note 05/21/16 "Prescibed 30 day supply of Lisinopril 10 mg for continuity care until appointment"

## 2016-09-25 ENCOUNTER — Other Ambulatory Visit: Payer: Self-pay | Admitting: Nurse Practitioner

## 2016-09-25 DIAGNOSIS — N951 Menopausal and female climacteric states: Secondary | ICD-10-CM

## 2016-09-25 DIAGNOSIS — Z1231 Encounter for screening mammogram for malignant neoplasm of breast: Secondary | ICD-10-CM

## 2016-10-10 ENCOUNTER — Other Ambulatory Visit: Payer: BLUE CROSS/BLUE SHIELD

## 2016-10-10 ENCOUNTER — Ambulatory Visit: Payer: BLUE CROSS/BLUE SHIELD

## 2016-10-15 ENCOUNTER — Ambulatory Visit
Admission: RE | Admit: 2016-10-15 | Discharge: 2016-10-15 | Disposition: A | Discharge status: Home / Self Care | Payer: 59 | Source: Ambulatory Visit | Attending: Nurse Practitioner | Admitting: Nurse Practitioner

## 2016-10-15 ENCOUNTER — Encounter: Payer: Self-pay | Admitting: Women's Health

## 2016-10-15 DIAGNOSIS — Z1231 Encounter for screening mammogram for malignant neoplasm of breast: Secondary | ICD-10-CM

## 2016-10-15 DIAGNOSIS — N951 Menopausal and female climacteric states: Secondary | ICD-10-CM

## 2017-05-26 ENCOUNTER — Telehealth: Payer: Self-pay | Admitting: *Deleted

## 2017-05-26 ENCOUNTER — Ambulatory Visit (INDEPENDENT_AMBULATORY_CARE_PROVIDER_SITE_OTHER): Payer: 59 | Admitting: Women's Health

## 2017-05-26 ENCOUNTER — Encounter: Payer: Self-pay | Admitting: Women's Health

## 2017-05-26 VITALS — BP 118/78 | Ht 62.0 in | Wt 167.0 lb

## 2017-05-26 DIAGNOSIS — Z01419 Encounter for gynecological examination (general) (routine) without abnormal findings: Secondary | ICD-10-CM

## 2017-05-26 MED ORDER — ESTRADIOL 10 MCG VA TABS
ORAL_TABLET | VAGINAL | 11 refills | Status: DC
Start: 2017-05-26 — End: 2019-09-13

## 2017-05-26 MED ORDER — NONFORMULARY OR COMPOUNDED ITEM: 3 refills | Status: DC

## 2017-05-26 NOTE — Patient Instructions (Signed)
Tdap vaccine  Health Maintenance for Postmenopausal Women Menopause is a normal process in which your reproductive ability comes to an end. This process happens gradually over a span of months to years, usually between the ages of 26 and 82. Menopause is complete when you have missed 12 consecutive menstrual periods. It is important to talk with your health care provider about some of the most common conditions that affect postmenopausal women, such as heart disease, cancer, and bone loss (osteoporosis). Adopting a healthy lifestyle and getting preventive care can help to promote your health and wellness. Those actions can also lower your chances of developing some of these common conditions. What should I know about menopause? During menopause, you may experience a number of symptoms, such as:  Moderate-to-severe hot flashes.  Night sweats.  Decrease in sex drive.  Mood swings.  Headaches.  Tiredness.  Irritability.  Memory problems.  Insomnia.  Choosing to treat or not to treat menopausal changes is an individual decision that you make with your health care provider. What should I know about hormone replacement therapy and supplements? Hormone therapy products are effective for treating symptoms that are associated with menopause, such as hot flashes and night sweats. Hormone replacement carries certain risks, especially as you become older. If you are thinking about using estrogen or estrogen with progestin treatments, discuss the benefits and risks with your health care provider. What should I know about heart disease and stroke? Heart disease, heart attack, and stroke become more likely as you age. This may be due, in part, to the hormonal changes that your body experiences during menopause. These can affect how your body processes dietary fats, triglycerides, and cholesterol. Heart attack and stroke are both medical emergencies. There are many things that you can do to help  prevent heart disease and stroke:  Have your blood pressure checked at least every 1-2 years. High blood pressure causes heart disease and increases the risk of stroke.  If you are 90-59 years old, ask your health care provider if you should take aspirin to prevent a heart attack or a stroke.  Do not use any tobacco products, including cigarettes, chewing tobacco, or electronic cigarettes. If you need help quitting, ask your health care provider.  It is important to eat a healthy diet and maintain a healthy weight. ? Be sure to include plenty of vegetables, fruits, low-fat dairy products, and lean protein. ? Avoid eating foods that are high in solid fats, added sugars, or salt (sodium).  Get regular exercise. This is one of the most important things that you can do for your health. ? Try to exercise for at least 150 minutes each week. The type of exercise that you do should increase your heart rate and make you sweat. This is known as moderate-intensity exercise. ? Try to do strengthening exercises at least twice each week. Do these in addition to the moderate-intensity exercise.  Know your numbers.Ask your health care provider to check your cholesterol and your blood glucose. Continue to have your blood tested as directed by your health care provider.  What should I know about cancer screening? There are several types of cancer. Take the following steps to reduce your risk and to catch any cancer development as early as possible. Breast Cancer  Practice breast self-awareness. ? This means understanding how your breasts normally appear and feel. ? It also means doing regular breast self-exams. Let your health care provider know about any changes, no matter how small.  If  you are 59 or older, have a clinician do a breast exam (clinical breast exam or CBE) every year. Depending on your age, family history, and medical history, it may be recommended that you also have a yearly breast X-ray  (mammogram).  If you have a family history of breast cancer, talk with your health care provider about genetic screening.  If you are at high risk for breast cancer, talk with your health care provider about having an MRI and a mammogram every year.  Breast cancer (BRCA) gene test is recommended for women who have family members with BRCA-related cancers. Results of the assessment will determine the need for genetic counseling and BRCA1 and for BRCA2 testing. BRCA-related cancers include these types: ? Breast. This occurs in males or females. ? Ovarian. ? Tubal. This may also be called fallopian tube cancer. ? Cancer of the abdominal or pelvic lining (peritoneal cancer). ? Prostate. ? Pancreatic.  Cervical, Uterine, and Ovarian Cancer Your health care provider may recommend that you be screened regularly for cancer of the pelvic organs. These include your ovaries, uterus, and vagina. This screening involves a pelvic exam, which includes checking for microscopic changes to the surface of your cervix (Pap test).  For women ages 21-65, health care providers may recommend a pelvic exam and a Pap test every three years. For women ages 30-65, they may recommend the Pap test and pelvic exam, combined with testing for human papilloma virus (HPV), every five years. Some types of HPV increase your risk of cervical cancer. Testing for HPV may also be done on women of any age who have unclear Pap test results.  Other health care providers may not recommend any screening for nonpregnant women who are considered low risk for pelvic cancer and have no symptoms. Ask your health care provider if a screening pelvic exam is right for you.  If you have had past treatment for cervical cancer or a condition that could lead to cancer, you need Pap tests and screening for cancer for at least 20 years after your treatment. If Pap tests have been discontinued for you, your risk factors (such as having a new sexual  partner) need to be reassessed to determine if you should start having screenings again. Some women have medical problems that increase the chance of getting cervical cancer. In these cases, your health care provider may recommend that you have screening and Pap tests more often.  If you have a family history of uterine cancer or ovarian cancer, talk with your health care provider about genetic screening.  If you have vaginal bleeding after reaching menopause, tell your health care provider.  There are currently no reliable tests available to screen for ovarian cancer.  Lung Cancer Lung cancer screening is recommended for adults 55-80 years old who are at high risk for lung cancer because of a history of smoking. A yearly low-dose CT scan of the lungs is recommended if you:  Currently smoke.  Have a history of at least 30 pack-years of smoking and you currently smoke or have quit within the past 15 years. A pack-year is smoking an average of one pack of cigarettes per day for one year.  Yearly screening should:  Continue until it has been 15 years since you quit.  Stop if you develop a health problem that would prevent you from having lung cancer treatment.  Colorectal Cancer  This type of cancer can be detected and can often be prevented.  Routine colorectal cancer screening   usually begins at age 77 and continues through age 62.  If you have risk factors for colon cancer, your health care provider may recommend that you be screened at an earlier age.  If you have a family history of colorectal cancer, talk with your health care provider about genetic screening.  Your health care provider may also recommend using home test kits to check for hidden blood in your stool.  A small camera at the end of a tube can be used to examine your colon directly (sigmoidoscopy or colonoscopy). This is done to check for the earliest forms of colorectal cancer.  Direct examination of the colon  should be repeated every 5-10 years until age 80. However, if early forms of precancerous polyps or small growths are found or if you have a family history or genetic risk for colorectal cancer, you may need to be screened more often.  Skin Cancer  Check your skin from head to toe regularly.  Monitor any moles. Be sure to tell your health care provider: ? About any new moles or changes in moles, especially if there is a change in a mole's shape or color. ? If you have a mole that is larger than the size of a pencil eraser.  If any of your family members has a history of skin cancer, especially at a Hannah Hanson age, talk with your health care provider about genetic screening.  Always use sunscreen. Apply sunscreen liberally and repeatedly throughout the day.  Whenever you are outside, protect yourself by wearing long sleeves, pants, a wide-brimmed hat, and sunglasses.  What should I know about osteoporosis? Osteoporosis is a condition in which bone destruction happens more quickly than new bone creation. After menopause, you may be at an increased risk for osteoporosis. To help prevent osteoporosis or the bone fractures that can happen because of osteoporosis, the following is recommended:  If you are 76-25 years old, get at least 1,000 mg of calcium and at least 600 mg of vitamin D per day.  If you are older than age 5 but younger than age 77, get at least 1,200 mg of calcium and at least 600 mg of vitamin D per day.  If you are older than age 71, get at least 1,200 mg of calcium and at least 800 mg of vitamin D per day.  Smoking and excessive alcohol intake increase the risk of osteoporosis. Eat foods that are rich in calcium and vitamin D, and do weight-bearing exercises several times each week as directed by your health care provider. What should I know about how menopause affects my mental health? Depression may occur at any age, but it is more common as you become older. Common symptoms of  depression include:  Low or sad mood.  Changes in sleep patterns.  Changes in appetite or eating patterns.  Feeling an overall lack of motivation or enjoyment of activities that you previously enjoyed.  Frequent crying spells.  Talk with your health care provider if you think that you are experiencing depression. What should I know about immunizations? It is important that you get and maintain your immunizations. These include:  Tetanus, diphtheria, and pertussis (Tdap) booster vaccine.  Influenza every year before the flu season begins.  Pneumonia vaccine.  Shingles vaccine.  Your health care provider may also recommend other immunizations. This information is not intended to replace advice given to you by your health care provider. Make sure you discuss any questions you have with your health care provider.  Document Released: 06/14/2005 Document Revised: 11/10/2015 Document Reviewed: 01/24/2015 Elsevier Interactive Patient Education  2018 [employer].

## 2017-05-26 NOTE — Telephone Encounter (Signed)
Patient said she would like to proceed with estradiol cream, Rx called in [place].

## 2017-05-26 NOTE — Telephone Encounter (Signed)
Patient was seen today for annual and vagifem generic Rx was over $200. Pt said too expensive, asked if another medication can be prescribed? Please advise

## 2017-05-26 NOTE — Progress Notes (Signed)
Hannah Hanson February 03, 1961 [id number]    History:    Presents for annual exam.  Postmenopausal, no HRT, no bleeding. Having difficulty with vaginal dryness with minimal relief with over-the-counter lubricants. Normal Pap and mammogram history. 2012 negative colonoscopy. 2018 T score -1.4 at spine, -0.9 at femoral neck FRAX 6.2%/0.3% Primary care manages hypertension and hypothyroidism.  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  ROS:  A ROS was performed and pertinent positives and negatives are included.  Exam:  Vitals:   05/26/17 0825  BP: 118/78  Weight: 167 lb (75.8 kg)  Height: 5\' 2"  (1.575 m)   Body mass index is 30.54 kg/m.   General appearance:  Normal Thyroid:  Symmetrical, normal in size, without palpable masses or nodularity. Respiratory  Auscultation:  Clear without wheezing or rhonchi Cardiovascular  Auscultation:  Regular rate, without rubs, murmurs or gallops  Edema/varicosities:  Not grossly evident Abdominal  Soft,nontender, without masses, guarding or rebound.  Liver/spleen:  No organomegaly noted  Hernia:  None appreciated  Skin  Inspection:  Grossly normal   Breasts: Examined lying and sitting.     Right: Without masses, retractions, discharge or axillary adenopathy.     Left: Without masses, retractions, discharge or axillary adenopathy. Gentitourinary   Inguinal/mons:  Normal without inguinal adenopathy  External genitalia:  Normal  BUS/Urethra/Skene's glands:  Normal  Vagina:    Cervix:  Normal  Uterus:  normal in size, shape and contour.  Midline and mobile  Adnexa/parametria:     Rt: Without masses or tenderness.   Lt: Without masses or tenderness.  Anus and perineum: Normal  Digital rectal exam: Normal sphincter tone without palpated  masses or tenderness  Assessment/Plan:  57 y.o. M WF G4 P3  for annual exam with complaint of vaginal dryness/dyspareunia.  Postmenopausal/no HRT/no bleeding Vaginal atrophy Hypertension/hyperthyroidism/anxiety-primary care manages labs and meds Osteopenia without elevated FRAX  Plan: T dap reviewed instructed to get at primary care, declined today. SBE's, continue annual screening mammogram, calcium rich diet, vitamin D 2000 daily encouraged. Options for vaginal dryness reviewed, Vagifem 1 applicator per vagina for 2 weeks and then twice weekly thereafter. Instructed to call if no relief. Continue over-the-counter lubricants. Encouraged to increase exercise, balance exercise reviewed and encouraged, weight loss, low sugar diet. Pap normal 2017, new screening guidelines reviewed.  [place], 8:56 AM 05/26/2017

## 2017-05-26 NOTE — Telephone Encounter (Signed)
Please call and tell her I am sorry, no idea what prescriptions cost. We could try estradiol 0.02% vaginal cream compounded at [facility] or pharmacy closer to her that compounds medications, she could apply 1 applicator twice weekly vaginally, dispense 1 tube with 12 refills.

## 2017-05-26 NOTE — Progress Notes (Signed)
[id number]

## 2017-05-27 LAB — URINALYSIS W MICROSCOPIC + REFLEX CULTURE
Bilirubin Urine: NEGATIVE
Glucose, UA: NEGATIVE
HGB URINE DIPSTICK: NEGATIVE
Hyaline Cast: NONE SEEN /LPF
KETONES UR: NEGATIVE
Nitrites, Initial: NEGATIVE
PH: 6 (ref 5.0–8.0)
Protein, ur: NEGATIVE
RBC / HPF: NONE SEEN /HPF (ref 0–2)
Specific Gravity, Urine: 1.021 (ref 1.001–1.03)

## 2017-07-15 ENCOUNTER — Other Ambulatory Visit: Payer: Self-pay | Admitting: Women's Health

## 2017-07-15 DIAGNOSIS — F4322 Adjustment disorder with anxiety: Secondary | ICD-10-CM

## 2017-09-04 ENCOUNTER — Telehealth: Payer: Self-pay | Admitting: *Deleted

## 2017-09-04 DIAGNOSIS — R5383 Other fatigue: Secondary | ICD-10-CM

## 2017-09-04 NOTE — Telephone Encounter (Signed)
Patient aware you are out of the office) patient called c/o feeling tired, had annual exam with you in Jan 2019. No longer has PCP, asked if some labs can be ordered? Please advise

## 2017-09-08 NOTE — Telephone Encounter (Signed)
Ok for TSH, Glu and CBC, have her make a lab appointment and she does  Need to be fasting.

## 2017-09-09 NOTE — Telephone Encounter (Signed)
Patient aware, orders placed, will come on 09/11/17 @ 9am fasting.

## 2017-09-11 ENCOUNTER — Other Ambulatory Visit: Payer: 59

## 2017-09-11 DIAGNOSIS — R5383 Other fatigue: Secondary | ICD-10-CM

## 2017-09-16 ENCOUNTER — Other Ambulatory Visit: Payer: Self-pay | Admitting: Women's Health

## 2017-09-16 DIAGNOSIS — R7309 Other abnormal glucose: Secondary | ICD-10-CM

## 2017-09-16 LAB — GLUCOSE, RANDOM: Glucose, Bld: 117 mg/dL — ABNORMAL HIGH (ref 65–99)

## 2017-09-16 LAB — CBC
HCT: 44.3 % (ref 35.0–45.0)
Hemoglobin: 15.1 g/dL (ref 11.7–15.5)
MCH: 29.8 pg (ref 27.0–33.0)
MCHC: 34.1 g/dL (ref 32.0–36.0)
MCV: 87.5 fL (ref 80.0–100.0)
MPV: 9 fL (ref 7.5–12.5)
Platelets: 183 10*3/uL (ref 140–400)
RBC: 5.06 10*6/uL (ref 3.80–5.10)
RDW: 12.6 % (ref 11.0–15.0)
WBC: 5.1 10*3/uL (ref 3.8–10.8)

## 2017-09-16 LAB — TSH: TSH: 2.94 m[IU]/L (ref 0.40–4.50)

## 2017-09-16 LAB — TEST AUTHORIZATION

## 2017-09-16 LAB — HEMOGLOBIN A1C
Hgb A1c MFr Bld: 5.9 % of total Hgb — ABNORMAL HIGH (ref <5.7)
Mean Plasma Glucose: 123 (calc)
eAG (mmol/L): 6.8 (calc)

## 2018-04-07 ENCOUNTER — Other Ambulatory Visit: Payer: Self-pay | Admitting: Nurse Practitioner

## 2018-04-07 DIAGNOSIS — Z1231 Encounter for screening mammogram for malignant neoplasm of breast: Secondary | ICD-10-CM

## 2018-05-27 ENCOUNTER — Encounter: Payer: 59 | Admitting: Women's Health

## 2018-07-29 ENCOUNTER — Encounter: Payer: 59 | Admitting: Women's Health

## 2018-08-31 ENCOUNTER — Other Ambulatory Visit: Payer: Self-pay

## 2018-09-01 ENCOUNTER — Ambulatory Visit (INDEPENDENT_AMBULATORY_CARE_PROVIDER_SITE_OTHER): Payer: 59 | Admitting: Women's Health

## 2018-09-01 ENCOUNTER — Encounter: Payer: Self-pay | Admitting: Women's Health

## 2018-09-01 VITALS — BP 120/82 | Ht 62.0 in | Wt 168.0 lb

## 2018-09-01 DIAGNOSIS — Z01419 Encounter for gynecological examination (general) (routine) without abnormal findings: Secondary | ICD-10-CM

## 2018-09-01 DIAGNOSIS — Z13820 Encounter for screening for osteoporosis: Secondary | ICD-10-CM

## 2018-09-01 NOTE — Progress Notes (Signed)
lab

## 2018-09-01 NOTE — Progress Notes (Signed)
Hannah Hanson [birth year] [id number]    History:    Presents for annual exam.  Postmenopausal on no HRT with no bleeding.  Normal Pap and mammogram history.  2018 T score -1.4 at spine FRAX 6.2% / 0.3%.  Primary care manages hypertension, hypothyroidism, anxiety and depression.  Infrequent intercourse, husband's health.  2012-/normal colonoscopy.  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  3 children all doing well.  One daughter in [place] has 3 children, 79-year-old grandson has had 4 fractures, ? bone issue.  ROS:  A ROS was performed and pertinent positives and negatives are included.  Exam:  Vitals:   09/01/18 0825  BP: 120/82  Weight: 168 lb (76.2 kg)  Height: 5\' 2"  (1.575 m)   Body mass index is 30.73 kg/m.   General appearance:  Normal Thyroid:  Symmetrical, normal in size, without palpable masses or nodularity. Respiratory  Auscultation:  Clear without wheezing or rhonchi Cardiovascular  Auscultation:  Regular rate, without rubs, murmurs or gallops  Edema/varicosities:  Not grossly evident Abdominal  Soft,nontender, without masses, guarding or rebound.  Liver/spleen:  No organomegaly noted  Hernia:  None appreciated  Skin  Inspection:  Grossly normal   Breasts: Examined lying and sitting.     Right: Without masses, retractions, discharge or axillary adenopathy.     Left: Without masses, retractions, discharge or axillary adenopathy. Gentitourinary   Inguinal/mons:  Normal without inguinal adenopathy  External genitalia:  Normal  BUS/Urethra/Skene's glands:  Normal  Vagina:  Normal  Cervix:  Normal  Uterus:   normal in size, shape and contour.  Midline and mobile  Adnexa/parametria:     Rt: Without masses or tenderness.   Lt: Without masses or tenderness.  Anus and perineum: Normal  Digital rectal exam: Normal sphincter  tone without palpated masses or tenderness  Assessment/Plan:  58 y.o. MWF G4, P3 for annual exam with no complaints.  Postmenopausal/no HRT/no bleeding Hypertension, hypothyroidism, anxiety/depression-primary care manages labs and meds  Plan: SBEs, annual mammogram, calcium rich foods, vitamin D 2000 daily encouraged.  Reviewed importance of weightbearing and balance type exercise, home safety, fall prevention.  Repeat DEXA.  Shingrex reviewed and encouraged.  Self-care, leisure activities encouraged.  Pap with HR HPV typing, new screening guidelines reviewed.   [place], 11:22 AM 09/01/2018

## 2018-09-01 NOTE — Patient Instructions (Signed)
shingrex vaccine 2 series get pharmacy  Health Maintenance for Postmenopausal Women Menopause is a normal process in which your reproductive ability comes to an end. This process happens gradually over a span of months to years, usually between the ages of 59 and 75. Menopause is complete when you have missed 12 consecutive menstrual periods. It is important to talk with your health care provider about some of the most common conditions that affect postmenopausal women, such as heart disease, cancer, and bone loss (osteoporosis). Adopting a healthy lifestyle and getting preventive care can help to promote your health and wellness. Those actions can also lower your chances of developing some of these common conditions. What should I know about menopause? During menopause, you may experience a number of symptoms, such as:  Moderate-to-severe hot flashes.  Night sweats.  Decrease in sex drive.  Mood swings.  Headaches.  Tiredness.  Irritability.  Memory problems.  Insomnia. Choosing to treat or not to treat menopausal changes is an individual decision that you make with your health care provider. What should I know about hormone replacement therapy and supplements? Hormone therapy products are effective for treating symptoms that are associated with menopause, such as hot flashes and night sweats. Hormone replacement carries certain risks, especially as you become older. If you are thinking about using estrogen or estrogen with progestin treatments, discuss the benefits and risks with your health care provider. What should I know about heart disease and stroke? Heart disease, heart attack, and stroke become more likely as you age. This may be due, in part, to the hormonal changes that your body experiences during menopause. These can affect how your body processes dietary fats, triglycerides, and cholesterol. Heart attack and stroke are both medical emergencies. There are many things  that you can do to help prevent heart disease and stroke:  Have your blood pressure checked at least every 1-2 years. High blood pressure causes heart disease and increases the risk of stroke.  If you are 27-56 years old, ask your health care provider if you should take aspirin to prevent a heart attack or a stroke.  Do not use any tobacco products, including cigarettes, chewing tobacco, or electronic cigarettes. If you need help quitting, ask your health care provider.  It is important to eat a healthy diet and maintain a healthy weight. ? Be sure to include plenty of vegetables, fruits, low-fat dairy products, and lean protein. ? Avoid eating foods that are high in solid fats, added sugars, or salt (sodium).  Get regular exercise. This is one of the most important things that you can do for your health. ? Try to exercise for at least 150 minutes each week. The type of exercise that you do should increase your heart rate and make you sweat. This is known as moderate-intensity exercise. ? Try to do strengthening exercises at least twice each week. Do these in addition to the moderate-intensity exercise.  Know your numbers.Ask your health care provider to check your cholesterol and your blood glucose. Continue to have your blood tested as directed by your health care provider.  What should I know about cancer screening? There are several types of cancer. Take the following steps to reduce your risk and to catch any cancer development as early as possible. Breast Cancer  Practice breast self-awareness. ? This means understanding how your breasts normally appear and feel. ? It also means doing regular breast self-exams. Let your health care provider know about any changes, no matter how  small.  If you are 40 or older, have a clinician do a breast exam (clinical breast exam or CBE) every year. Depending on your age, family history, and medical history, it may be recommended that you also have  a yearly breast X-ray (mammogram).  If you have a family history of breast cancer, talk with your health care provider about genetic screening.  If you are at high risk for breast cancer, talk with your health care provider about having an MRI and a mammogram every year.  Breast cancer (BRCA) gene test is recommended for women who have family members with BRCA-related cancers. Results of the assessment will determine the need for genetic counseling and BRCA1 and for BRCA2 testing. BRCA-related cancers include these types: ? Breast. This occurs in males or females. ? Ovarian. ? Tubal. This may also be called fallopian tube cancer. ? Cancer of the abdominal or pelvic lining (peritoneal cancer). ? Prostate. ? Pancreatic. Cervical, Uterine, and Ovarian Cancer Your health care provider may recommend that you be screened regularly for cancer of the pelvic organs. These include your ovaries, uterus, and vagina. This screening involves a pelvic exam, which includes checking for microscopic changes to the surface of your cervix (Pap test).  For women ages 21-65, health care providers may recommend a pelvic exam and a Pap test every three years. For women ages 30-65, they may recommend the Pap test and pelvic exam, combined with testing for human papilloma virus (HPV), every five years. Some types of HPV increase your risk of cervical cancer. Testing for HPV may also be done on women of any age who have unclear Pap test results.  Other health care providers may not recommend any screening for nonpregnant women who are considered low risk for pelvic cancer and have no symptoms. Ask your health care provider if a screening pelvic exam is right for you.  If you have had past treatment for cervical cancer or a condition that could lead to cancer, you need Pap tests and screening for cancer for at least 20 years after your treatment. If Pap tests have been discontinued for you, your risk factors (such as having  a new sexual partner) need to be reassessed to determine if you should start having screenings again. Some women have medical problems that increase the chance of getting cervical cancer. In these cases, your health care provider may recommend that you have screening and Pap tests more often.  If you have a family history of uterine cancer or ovarian cancer, talk with your health care provider about genetic screening.  If you have vaginal bleeding after reaching menopause, tell your health care provider.  There are currently no reliable tests available to screen for ovarian cancer. Lung Cancer Lung cancer screening is recommended for adults 55-80 years old who are at high risk for lung cancer because of a history of smoking. A yearly low-dose CT scan of the lungs is recommended if you:  Currently smoke.  Have a history of at least 30 pack-years of smoking and you currently smoke or have quit within the past 15 years. A pack-year is smoking an average of one pack of cigarettes per day for one year. Yearly screening should:  Continue until it has been 15 years since you quit.  Stop if you develop a health problem that would prevent you from having lung cancer treatment. Colorectal Cancer  This type of cancer can be detected and can often be prevented.  Routine colorectal cancer screening usually   begins at age 50 and continues through age 75.  If you have risk factors for colon cancer, your health care provider may recommend that you be screened at an earlier age.  If you have a family history of colorectal cancer, talk with your health care provider about genetic screening.  Your health care provider may also recommend using home test kits to check for hidden blood in your stool.  A small camera at the end of a tube can be used to examine your colon directly (sigmoidoscopy or colonoscopy). This is done to check for the earliest forms of colorectal cancer.  Direct examination of the  colon should be repeated every 5-10 years until age 75. However, if early forms of precancerous polyps or small growths are found or if you have a family history or genetic risk for colorectal cancer, you may need to be screened more often. Skin Cancer  Check your skin from head to toe regularly.  Monitor any moles. Be sure to tell your health care provider: ? About any new moles or changes in moles, especially if there is a change in a mole's shape or color. ? If you have a mole that is larger than the size of a pencil eraser.  If any of your family members has a history of skin cancer, especially at a young age, talk with your health care provider about genetic screening.  Always use sunscreen. Apply sunscreen liberally and repeatedly throughout the day.  Whenever you are outside, protect yourself by wearing long sleeves, pants, a wide-brimmed hat, and sunglasses. What should I know about osteoporosis? Osteoporosis is a condition in which bone destruction happens more quickly than new bone creation. After menopause, you may be at an increased risk for osteoporosis. To help prevent osteoporosis or the bone fractures that can happen because of osteoporosis, the following is recommended:  If you are 19-50 years old, get at least 1,000 mg of calcium and at least 600 mg of vitamin D per day.  If you are older than age 50 but younger than age 70, get at least 1,200 mg of calcium and at least 600 mg of vitamin D per day.  If you are older than age 70, get at least 1,200 mg of calcium and at least 800 mg of vitamin D per day. Smoking and excessive alcohol intake increase the risk of osteoporosis. Eat foods that are rich in calcium and vitamin D, and do weight-bearing exercises several times each week as directed by your health care provider. What should I know about how menopause affects my mental health? Depression may occur at any age, but it is more common as you become older. Common symptoms of  depression include:  Low or sad mood.  Changes in sleep patterns.  Changes in appetite or eating patterns.  Feeling an overall lack of motivation or enjoyment of activities that you previously enjoyed.  Frequent crying spells. Talk with your health care provider if you think that you are experiencing depression. What should I know about immunizations? It is important that you get and maintain your immunizations. These include:  Tetanus, diphtheria, and pertussis (Tdap) booster vaccine.  Influenza every year before the flu season begins.  Pneumonia vaccine.  Shingles vaccine. Your health care provider may also recommend other immunizations. This information is not intended to replace advice given to you by your health care provider. Make sure you discuss any questions you have with your health care provider. Document Released: 06/14/2005 Document Revised: 11/10/2015   Document Reviewed: 01/24/2015 Elsevier Interactive Patient Education  [employer].

## 2018-09-02 LAB — PAP, TP IMAGING W/ HPV RNA, RFLX HPV TYPE 16,18/45: HPV DNA High Risk: NOT DETECTED

## 2018-09-03 LAB — URINALYSIS, COMPLETE W/RFL CULTURE
Bacteria, UA: NONE SEEN /HPF
Bilirubin Urine: NEGATIVE
Glucose, UA: NEGATIVE
Hgb urine dipstick: NEGATIVE
Hyaline Cast: NONE SEEN /LPF
Ketones, ur: NEGATIVE
Nitrites, Initial: NEGATIVE
Protein, ur: NEGATIVE
RBC / HPF: NONE SEEN /HPF (ref 0–2)
Specific Gravity, Urine: 1.023 (ref 1.001–1.03)
pH: <=5 (ref 5.0–8.0)

## 2018-09-03 LAB — URINE CULTURE
MICRO NUMBER:: 431496
Result:: NO GROWTH
SPECIMEN QUALITY:: ADEQUATE

## 2018-09-03 LAB — CULTURE INDICATED

## 2018-09-28 ENCOUNTER — Other Ambulatory Visit: Payer: Self-pay | Admitting: Women's Health

## 2018-09-28 DIAGNOSIS — F4322 Adjustment disorder with anxiety: Secondary | ICD-10-CM

## 2018-09-29 NOTE — Telephone Encounter (Signed)
Patient annual exam was 09/01/18, I see you filled last year.

## 2018-09-29 NOTE — Telephone Encounter (Signed)
Ok for refill? 

## 2019-09-03 ENCOUNTER — Other Ambulatory Visit: Payer: Self-pay

## 2019-09-06 ENCOUNTER — Encounter: Payer: 59 | Admitting: Nurse Practitioner

## 2019-09-10 ENCOUNTER — Other Ambulatory Visit: Payer: Self-pay

## 2019-09-13 ENCOUNTER — Ambulatory Visit (INDEPENDENT_AMBULATORY_CARE_PROVIDER_SITE_OTHER): Payer: 59 | Admitting: Nurse Practitioner

## 2019-09-13 ENCOUNTER — Other Ambulatory Visit: Payer: Self-pay

## 2019-09-13 ENCOUNTER — Encounter: Payer: Self-pay | Admitting: Nurse Practitioner

## 2019-09-13 VITALS — Ht 62.0 in | Wt 170.0 lb

## 2019-09-13 DIAGNOSIS — Z01419 Encounter for gynecological examination (general) (routine) without abnormal findings: Secondary | ICD-10-CM

## 2019-09-13 DIAGNOSIS — R5383 Other fatigue: Secondary | ICD-10-CM | POA: Diagnosis not present

## 2019-09-13 NOTE — Progress Notes (Signed)
   Hannah Hanson Oct 11, 1960 [id number]   History:  59 y.o. MWF G4 P3 presents for annual exam. Complains of fatigue and difficulty falling asleep.  Has tried melatonin, this helps but makes her feel groggy the next day.  Postmenopausal-no HRT.  Normal Pap and mammogram history. History of hypertension, hypothyroidism, anxiety and depression-managed by PCP with [facility].  Has been on Zoloft since the 90s.  Had labs recently, will get them sent here.  Gynecologic History Patient's last menstrual period was 04/13/2015.   Contrace/ption: post menopausal status Last Pap: 08/12/2018. Results were:normal Last mammogram: 10/15/2016. Results were: normal DEXA: 10/15/2016: Results were: T score -1.4 at spine FRAX 6.2%/ 0.3%. Colonoscopy: 2012.  Results: normal  Past medical history, past surgical history, family history and social history were all reviewed and documented in the EPIC chart.  Works from home, [employer].  3 children all doing well, 3 grandchildren in [place].  ROS:  A ROS was performed and pertinent positives and negatives are included.  Exam:  Vitals:   09/13/19 1026  Weight: 170 lb (77.1 kg)  Height: 5\' 2"  (1.575 m)   Body mass index is 31.09 kg/m.  General appearance:  Normal Thyroid:  Symmetrical, normal in size, without palpable masses or nodularity. Respiratory  Auscultation:  Clear without wheezing or rhonchi Cardiovascular  Auscultation:  Regular rate, without rubs, murmurs or gallops  Edema/varicosities:  Not grossly evident Abdominal  Soft,nontender, without masses, guarding or rebound.  Liver/spleen:  No organomegaly noted  Hernia:  None appreciated  Skin  Inspection:  Grossly normal   Breasts: Examined lying and sitting.   Right: Without masses, retractions, discharge or axillary adenopathy.   Left: Without masses, retractions, discharge or axillary adenopathy. Gentitourinary   Inguinal/mons:  Normal without inguinal adenopathy  External  genitalia:  Normal  BUS/Urethra/Skene's glands:  Normal  Vagina:  Atrophy  Cervix:  Normal  Uterus:  Normal in size, shape and contour.  Midline and mobile  Adnexa/parametria:     Rt: Without masses or tenderness.   Lt: Without masses or tenderness.  Anus and perineum: Normal  Digital rectal exam: Normal sphincter tone without palpated masses or tenderness  Assessment/Plan:  59 y.o. MWF G4P4 presents for annual exam without GYN complaints.     Well female exam with routine gynecological exam - Education provided on SBEs, importance of preventative screenings, current guidelines, high calcium diet, vitamin D supplement, multivitamin daily, and regular exercise including weight-bearing exercises. Plans to schedule mammogram and bone scan soon.   Fatigue, unspecified - Discussed changing Zoloft to another SSRI due to long history of being on it as this may help with fatigue and insomnia. TSH checked recently at PCP but we do not have records of this. She plans to get them sent here.    Follow up in 1 year for annual      [place], 10:49 AM 09/13/2019

## 2019-09-13 NOTE — Patient Instructions (Signed)
Schedule mammogram and Bone Scan   Health Maintenance, Female Adopting a healthy lifestyle and getting preventive care are important in promoting health and wellness. Ask your health care provider about:  The right schedule for you to have regular tests and exams.  Things you can do on your own to prevent diseases and keep yourself healthy. What should I know about diet, weight, and exercise? Eat a healthy diet   Eat a diet that includes plenty of vegetables, fruits, low-fat dairy products, and lean protein.  Do not eat a lot of foods that are high in solid fats, added sugars, or sodium. Maintain a healthy weight Body mass index (BMI) is used to identify weight problems. It estimates body fat based on height and weight. Your health care provider can help determine your BMI and help you achieve or maintain a healthy weight. Get regular exercise Get regular exercise. This is one of the most important things you can do for your health. Most adults should:  Exercise for at least 150 minutes each week. The exercise should increase your heart rate and make you sweat (moderate-intensity exercise).  Do strengthening exercises at least twice a week. This is in addition to the moderate-intensity exercise.  Spend less time sitting. Even light physical activity can be beneficial. Watch cholesterol and blood lipids Have your blood tested for lipids and cholesterol at 61 years of age, then have this test every 5 years. Have your cholesterol levels checked more often if:  Your lipid or cholesterol levels are high.  You are older than 61 years of age.  You are at high risk for heart disease. What should I know about cancer screening? Depending on your health history and family history, you may need to have cancer screening at various ages. This may include screening for:  Breast cancer.  Cervical cancer.  Colorectal cancer.  Skin cancer.  Lung cancer. What should I know about heart  disease, diabetes, and high blood pressure? Blood pressure and heart disease  High blood pressure causes heart disease and increases the risk of stroke. This is more likely to develop in people who have high blood pressure readings, are of African descent, or are overweight.  Have your blood pressure checked: ? Every 3-5 years if you are 61-49 years of age. ? Every year if you are 73 years old or older. Diabetes Have regular diabetes screenings. This checks your fasting blood sugar level. Have the screening done:  Once every three years after age 48 if you are at a normal weight and have a low risk for diabetes.  More often and at a younger age if you are overweight or have a high risk for diabetes. What should I know about preventing infection? Hepatitis B If you have a higher risk for hepatitis B, you should be screened for this virus. Talk with your health care provider to find out if you are at risk for hepatitis B infection. Hepatitis C Testing is recommended for:  Everyone born from [birth year] through 1967.  Anyone with known risk factors for hepatitis C. Sexually transmitted infections (STIs)  Get screened for STIs, including gonorrhea and chlamydia, if: ? You are sexually active and are younger than 61 years of age. ? You are older than 61 years of age and your health care provider tells you that you are at risk for this type of infection. ? Your sexual activity has changed since you were last screened, and you are at increased risk for chlamydia or  gonorrhea. Ask your health care provider if you are at risk.  Ask your health care provider about whether you are at high risk for HIV. Your health care provider may recommend a prescription medicine to help prevent HIV infection. If you choose to take medicine to prevent HIV, you should first get tested for HIV. You should then be tested every 3 months for as long as you are taking the medicine. Pregnancy  If you are about to stop  having your period (premenopausal) and you may become pregnant, seek counseling before you get pregnant.  Take 400 to 800 micrograms (mcg) of folic acid every day if you become pregnant.  Ask for birth control (contraception) if you want to prevent pregnancy. Osteoporosis and menopause Osteoporosis is a disease in which the bones lose minerals and strength with aging. This can result in bone fractures. If you are 9 years old or older, or if you are at risk for osteoporosis and fractures, ask your health care provider if you should:  Be screened for bone loss.  Take a calcium or vitamin D supplement to lower your risk of fractures.  Be given hormone replacement therapy (HRT) to treat symptoms of menopause. Follow these instructions at home: Lifestyle  Do not use any products that contain nicotine or tobacco, such as cigarettes, e-cigarettes, and chewing tobacco. If you need help quitting, ask your health care provider.  Do not use street drugs.  Do not share needles.  Ask your health care provider for help if you need support or information about quitting drugs. Alcohol use  Do not drink alcohol if: ? Your health care provider tells you not to drink. ? You are pregnant, may be pregnant, or are planning to become pregnant.  If you drink alcohol: ? Limit how much you use to 0-1 drink a day. ? Limit intake if you are breastfeeding.  Be aware of how much alcohol is in your drink. In the [place] one drink equals one 12 oz bottle of beer (355 mL), one 5 oz glass of wine (148 mL), or one 1 oz glass of hard liquor (44 mL). General instructions  Schedule regular health, dental, and eye exams.  Stay current with your vaccines.  Tell your health care provider if: ? You often feel depressed. ? You have ever been abused or do not feel safe at home. Summary  Adopting a healthy lifestyle and getting preventive care are important in promoting health and wellness.  Follow your health  care provider's instructions about healthy diet, exercising, and getting tested or screened for diseases.  Follow your health care provider's instructions on monitoring your cholesterol and blood pressure. This information is not intended to replace advice given to you by your health care provider. Make sure you discuss any questions you have with your health care provider. Document Revised: 04/15/2018 Document Reviewed: 04/15/2018 Elsevier Patient Education  2020 [employer].

## 2019-09-28 ENCOUNTER — Telehealth: Payer: Self-pay | Admitting: *Deleted

## 2019-09-28 DIAGNOSIS — N760 Acute vaginitis: Secondary | ICD-10-CM

## 2019-09-28 NOTE — Telephone Encounter (Signed)
Patient called requesting dexa order placed at the breast center. Order placed patient will call to schedule.

## 2019-09-29 ENCOUNTER — Other Ambulatory Visit: Payer: Self-pay | Admitting: Nurse Practitioner

## 2019-09-29 DIAGNOSIS — Z1231 Encounter for screening mammogram for malignant neoplasm of breast: Secondary | ICD-10-CM

## 2019-10-06 ENCOUNTER — Ambulatory Visit
Admission: RE | Admit: 2019-10-06 | Discharge: 2019-10-06 | Disposition: A | Discharge status: Home / Self Care | Payer: 59 | Source: Ambulatory Visit | Attending: Nurse Practitioner | Admitting: Nurse Practitioner

## 2019-10-06 ENCOUNTER — Other Ambulatory Visit: Payer: Self-pay

## 2019-10-06 DIAGNOSIS — N760 Acute vaginitis: Secondary | ICD-10-CM

## 2019-10-06 DIAGNOSIS — Z1231 Encounter for screening mammogram for malignant neoplasm of breast: Secondary | ICD-10-CM

## 2019-10-07 ENCOUNTER — Other Ambulatory Visit: Payer: Self-pay | Admitting: Nurse Practitioner

## 2019-10-07 DIAGNOSIS — R928 Other abnormal and inconclusive findings on diagnostic imaging of breast: Secondary | ICD-10-CM

## 2019-10-12 ENCOUNTER — Telehealth: Payer: Self-pay

## 2019-10-12 NOTE — Telephone Encounter (Signed)
Staff message from Marny Lowenstein, NP/ "Please let Myia know her Bone Scan did show a small decrease in bone density but she is still in the osteopenic range. I recommend vitamin D 1000 units daily and weight-bearing exercises 4-5 days a week and repeat scan in 2 years. Thank you."  I sent patient a My Chart email informing her.

## 2019-10-13 ENCOUNTER — Ambulatory Visit
Admission: RE | Admit: 2019-10-13 | Discharge: 2019-10-13 | Disposition: A | Discharge status: Home / Self Care | Payer: 59 | Source: Ambulatory Visit | Attending: Nurse Practitioner | Admitting: Nurse Practitioner

## 2019-10-13 ENCOUNTER — Other Ambulatory Visit: Payer: Self-pay

## 2019-10-13 DIAGNOSIS — R928 Other abnormal and inconclusive findings on diagnostic imaging of breast: Secondary | ICD-10-CM

## 2019-10-18 ENCOUNTER — Other Ambulatory Visit: Payer: Self-pay | Admitting: *Deleted

## 2019-10-18 DIAGNOSIS — F4322 Adjustment disorder with anxiety: Secondary | ICD-10-CM

## 2019-10-18 MED ORDER — SERTRALINE HCL 50 MG PO TABS: ORAL_TABLET | ORAL | 3 refills | Status: DC

## 2019-10-26 ENCOUNTER — Telehealth: Payer: Self-pay

## 2019-10-26 ENCOUNTER — Other Ambulatory Visit: Payer: Self-pay

## 2019-10-26 DIAGNOSIS — M8580 Other specified disorders of bone density and structure, unspecified site: Secondary | ICD-10-CM

## 2019-10-26 NOTE — Telephone Encounter (Signed)
Spoke with patient and informed her. °

## 2019-10-26 NOTE — Telephone Encounter (Signed)
Patient advised of result/recommendation. She is currently taking Vit D 5000 iu's daily. She asked if she could have Vit D level checked.  I scheduled lab appt for 11/17/19 and order placed.  Ok?

## 2019-10-26 NOTE — Telephone Encounter (Signed)
-----   Message from Princess Bruins, MD sent at 10/08/2019  2:06 PM EDT ----- BD Osteopenia with T-Score -1.7 at the Lumbar Spine.  Vit D supplements, Ca++ 1200 mg daily, regular weightbearing physical activity.  Repeat BD in 2 years.

## 2019-10-28 NOTE — Telephone Encounter (Signed)
Message came back unread , I called patient to relay message and received her voicemail. I left detailed message on voicemail per DPR access with recommendations.

## 2019-11-02 NOTE — Telephone Encounter (Signed)
Agree with Vit D check.

## 2019-11-17 ENCOUNTER — Other Ambulatory Visit: Payer: Self-pay

## 2019-11-24 ENCOUNTER — Other Ambulatory Visit: Payer: Self-pay

## 2019-11-24 ENCOUNTER — Other Ambulatory Visit: Payer: 59

## 2019-11-24 DIAGNOSIS — Z780 Asymptomatic menopausal state: Secondary | ICD-10-CM

## 2019-11-25 LAB — VITAMIN D 25 HYDROXY (VIT D DEFICIENCY, FRACTURES): Vit D, 25-Hydroxy: 57 ng/mL (ref 30–100)

## 2020-05-10 ENCOUNTER — Encounter: Payer: Self-pay | Admitting: Gastroenterology

## 2020-06-21 ENCOUNTER — Other Ambulatory Visit: Payer: Self-pay

## 2020-06-21 ENCOUNTER — Encounter: Payer: Self-pay | Admitting: Gastroenterology

## 2020-06-21 ENCOUNTER — Ambulatory Visit (INDEPENDENT_AMBULATORY_CARE_PROVIDER_SITE_OTHER): Payer: 59 | Admitting: Gastroenterology

## 2020-06-21 VITALS — BP 142/92 | HR 64 | Ht 61.0 in | Wt 171.0 lb

## 2020-06-21 DIAGNOSIS — Z1211 Encounter for screening for malignant neoplasm of colon: Secondary | ICD-10-CM

## 2020-06-21 DIAGNOSIS — R1310 Dysphagia, unspecified: Secondary | ICD-10-CM

## 2020-06-21 DIAGNOSIS — K219 Gastro-esophageal reflux disease without esophagitis: Secondary | ICD-10-CM

## 2020-06-21 MED ORDER — SUPREP BOWEL PREP KIT 17.5-3.13-1.6 GM/177ML PO SOLN: 1.0000 | ORAL | 0 refills | Status: DC

## 2020-06-21 NOTE — Patient Instructions (Addendum)
If you are age 62 or younger, your body mass index should be between 19-25. Your Body mass index is 32.31 kg/m. If this is out of the aformentioned range listed, please consider follow up with your Primary Care Provider.   You have been scheduled for an endoscopy and colonoscopy. Please follow the written instructions given to you at your visit today. Please pick up your prep supplies at the pharmacy within the next 1-3 days. If you use inhalers (even only as needed), please bring them with you on the day of your procedure.  Due to recent changes in healthcare laws, you may see the results of your imaging and laboratory studies on MyChart before your provider has had a chance to review them.  We understand that in some cases there may be results that are confusing or concerning to you. Not all laboratory results come back in the same time frame and the provider may be waiting for multiple results in order to interpret others.  Please give [place] 48 hours in order for your provider to thoroughly review all the results before contacting the office for clarification of your results.   Please take famotidine at night instead of in the morning.  Thank you for entrusting me with your care and choosing [facility].  Dr Ardis Hughs

## 2020-06-21 NOTE — Progress Notes (Signed)
Review of pertinent gastrointestinal problems: 1. Routine risk for colon cancer, colonoscopy Dr. Ardis Hughs December 2012 done for routine risk screening. Colonoscopy was normal. Recommended recall at 10 year interval. 2.  Schatzki's ring causing intermittent dysphagia.  EGD October 2014 Schatzki's ring noted above a small hiatal hernia.  Ring was dilated to 20 mm with a TTS balloon.  This helped her dysphagia for many years.   HPI: This is a very pleasant 60 year old woman  I last saw her about 8 years ago at the time of an upper endoscopy.  See those results summarized above.  She had many years of dysphagia relief from that EGD and dilation.  Her dysphagia has slowly returned.  Only to solid foods, meats and breads for example.  These will lodge in her distal esophagus about twice per week.  Since making this appointment she has not been bothered at all.  She has gained about 10 pounds in the last 6 months.  She has no overt GI bleeding.  She does have pyrosis and acid regurgitation symptoms periodically throughout the day but almost every night after laying down.  She takes a famotidine she believes 20 mg pills 1 pill every morning.  If she does not take this she is quite bothered during the day.  She has Tums by her bedside which she will take at night on a as needed basis.  She has mild chronic bowel irregularity is not bothersome to her.  She has no overt GI bleeding.  Her family is very private about their health concerns and so she is not aware of colon cancer runs in her family   Review of systems: Pertinent positive and negative review of systems were noted in the above HPI section. All other review negative.   Past Medical History:  Diagnosis Date  . Hypertension   . STD (sexually transmitted disease)    HSV  . Thyroid disease    Hypothyroid-Nodule    Past Surgical History:  Procedure Laterality Date  . CARPAL TUNNEL RELEASE Right   . [facility], [id number]  .  FOOT SURGERY     left  . TUBAL LIGATION      Current Outpatient Medications  Medication Sig Dispense Refill  . cholecalciferol (VITAMIN D3) 25 MCG (1000 UNIT) tablet Take 1,000 Units by mouth daily.    . famotidine (PEPCID) 20 MG tablet Take 20 mg by mouth daily.    . Levothyroxine Sodium 88 MCG CAPS Take by mouth daily before breakfast.    . lisinopril (PRINIVIL,ZESTRIL) 10 MG tablet Take 1 tablet (10 mg total) by mouth daily. 30 tablet 0  . loratadine (CLARITIN) 10 MG tablet Take 10 mg by mouth daily.    . sertraline (ZOLOFT) 50 MG tablet TAKE 1 TABLET(50 MG) BY MOUTH DAILY 90 tablet 3  . vitamin C (ASCORBIC ACID) 500 MG tablet Take 500 mg by mouth daily.     No current facility-administered medications for this visit.    Allergies as of 06/21/2020 - Review Complete 06/21/2020  Allergen Reaction Noted  . Sulfa antibiotics Rash 10/25/2010    Family History  Problem Relation Age of Onset  . Diabetes Mother   . Hypertension Mother   . Heart disease Paternal Grandfather   . Heart disease Father   . Diabetes Brother   . Diabetes Maternal Grandfather   . Colon cancer Neg Hx   . Breast cancer Neg Hx   . Esophageal cancer Neg Hx   . Pancreatic cancer  Neg Hx   . Stomach cancer Neg Hx     Social History   Socioeconomic History  . Marital status: Married    Spouse name: Not on file  . Number of children: Not on file  . Years of education: Not on file  . Highest education level: Not on file  Occupational History  . Not on file  Tobacco Use  . Smoking status: Former Smoker    Types: Cigarettes  . Smokeless tobacco: Never Used  Vaping Use  . Vaping Use: Never used  Substance and Sexual Activity  . Alcohol use: No    Alcohol/week: 0.0 standard drinks  . Drug use: No  . Sexual activity: Yes    Partners: Male    Birth control/protection: Surgical    Comment: BTL, iNTERCOURSE AGE 53, LESS THAN 5 SEXUAL PARTNERS  Other Topics Concern  . Not on file  Social History  Narrative  . Not on file   Social Determinants of Health   Financial Resource Strain: Not on file  Food Insecurity: Not on file  Transportation Needs: Not on file  Physical Activity: Not on file  Stress: Not on file  Social Connections: Not on file  Intimate Partner Violence: Not on file     Physical Exam: Ht 5\' 1"  (1.549 m)   Wt 171 lb (77.6 kg)   LMP 04/13/2015   BMI 32.31 kg/m  Constitutional: generally well-appearing Psychiatric: alert and oriented x3 Eyes: extraocular movements intact Mouth: oral pharynx moist, no lesions Neck: supple no lymphadenopathy Cardiovascular: heart regular rate and rhythm Lungs: clear to auscultation bilaterally Abdomen: soft, nontender, nondistended, no obvious ascites, no peritoneal signs, normal bowel sounds Extremities: no lower extremity edema bilaterally Skin: no lesions on visible extremities   Assessment and plan: 60 y.o. female with routine risk for colon cancer, chronic GERD, intermittent dysphagia  First I recommended that she change the way she is taking her H2 blocker to bedtime dosing instead of morning dosing.  This will undoubtedly help her overnight acid issues and may also help her daytime issues as well.  I recommended upper endoscopy to exclude significant causes which I think are unlikely but also to proceed with dilation if the Schatzki's ring has returned.  At the same time I recommended colonoscopy for routine risk colon cancer screening her last one was almost 10 years ago.  I see no reason for any further blood tests or imaging studies prior to then.  Please see the "Patient Instructions" section for addition details about the plan.   Owens Loffler, MD [facility] 06/21/2020, 8:29 AM  Total time on date of encounter was 45  minutes (this included time spent preparing to see the patient reviewing records; obtaining and/or reviewing separately obtained history; performing a medically appropriate exam  and/or evaluation; counseling and educating the patient and family if present; ordering medications, tests or procedures if applicable; and documenting clinical information in the health record).

## 2020-08-15 ENCOUNTER — Encounter: Payer: Self-pay | Admitting: Gastroenterology

## 2020-08-15 ENCOUNTER — Telehealth: Payer: Self-pay | Admitting: Gastroenterology

## 2020-08-15 NOTE — Telephone Encounter (Signed)
Cornelius Moras..   Patient did reschedule procedure for 11-14-20.

## 2020-08-15 NOTE — Telephone Encounter (Signed)
Called to remind patient of her procedure and she stated she had to reschedule appointment.  She is having unexpected company for the holiday.

## 2020-08-21 ENCOUNTER — Encounter: Payer: 59 | Admitting: Gastroenterology

## 2020-09-13 ENCOUNTER — Encounter: Payer: 59 | Admitting: Nurse Practitioner

## 2020-10-17 ENCOUNTER — Ambulatory Visit (INDEPENDENT_AMBULATORY_CARE_PROVIDER_SITE_OTHER): Payer: 59 | Admitting: Nurse Practitioner

## 2020-10-17 ENCOUNTER — Other Ambulatory Visit: Payer: Self-pay

## 2020-10-17 ENCOUNTER — Encounter: Payer: Self-pay | Admitting: Nurse Practitioner

## 2020-10-17 VITALS — BP 124/78 | Ht 61.5 in | Wt 169.0 lb

## 2020-10-17 DIAGNOSIS — M8589 Other specified disorders of bone density and structure, multiple sites: Secondary | ICD-10-CM

## 2020-10-17 DIAGNOSIS — Z780 Asymptomatic menopausal state: Secondary | ICD-10-CM | POA: Diagnosis not present

## 2020-10-17 DIAGNOSIS — Z01419 Encounter for gynecological examination (general) (routine) without abnormal findings: Secondary | ICD-10-CM | POA: Diagnosis not present

## 2020-10-17 NOTE — Progress Notes (Signed)
   Louine Tenpenny Jan 10, 1961 [id number]   History:  60 y.o. [id number] presents for annual exam without GYN complaints. Postmenopausal - no HRT, no bleeding. Normal pap and mammogram history. Osteopenia. HTN, hypothyroidism, anxiety/depression managed by PCP.   Gynecologic History Patient's last menstrual period was 04/13/2015.   Contraception/Family planning: post menopausal status  Health Maintenance Last Pap: 09/01/2018. Results were: Normal Last mammogram: 10/2019. Results were: Normal following ultrasound Last colonoscopy: 2012. Results were: Normal, 10-year recall Last Dexa: 10/06/2019. Results were: T-score -1.7, FRAX 7.0% / 0.4%  Past medical history, past surgical history, family history and social history were all reviewed and documented in the EPIC chart. Married. Retired May 2022. 3 children, 3 grandchildren in [place].   ROS:  A ROS was performed and pertinent positives and negatives are included.  Exam:  Vitals:   10/17/20 0825  BP: 124/78  Weight: 169 lb (76.7 kg)  Height: 5' 1.5" (1.562 m)   Body mass index is 31.42 kg/m.  General appearance:  Normal Thyroid:  Symmetrical, normal in size, without palpable masses or nodularity. Respiratory  Auscultation:  Clear without wheezing or rhonchi Cardiovascular  Auscultation:  Regular rate, without rubs, murmurs or gallops  Edema/varicosities:  Not grossly evident Abdominal  Soft,nontender, without masses, guarding or rebound.  Liver/spleen:  No organomegaly noted  Hernia:  None appreciated  Skin  Inspection:  Grossly normal Breasts: Examined lying and sitting.   Right: Without masses, retractions, nipple discharge or axillary adenopathy.   Left: Without masses, retractions, nipple discharge or axillary adenopathy. Genitourinary   Inguinal/mons:  Normal without inguinal adenopathy  External genitalia:  Normal appearing vulva with no masses, tenderness, or lesions  BUS/Urethra/Skene's glands:  Normal  Vagina:   Normal appearing with normal color and discharge, no lesions. Atrophic changes.   Cervix:  Normal appearing without discharge or lesions  Uterus:  Normal in size, shape and contour.  Midline and mobile, nontender  Adnexa/parametria:     Rt: Normal in size, without masses or tenderness.   Lt: Normal in size, without masses or tenderness.  Anus and perineum: Normal  Digital rectal exam: Declined  Patient informed chaperone available to be present for breast and pelvic exam. Patient has requested no chaperone to be present. Patient has been advised what will be completed during breast and pelvic exam.    Assessment/Plan:  60 y.o. [id number] for annual exam.   Well female exam with routine gynecological exam - Education provided on SBEs, importance of preventative screenings, current guidelines, high calcium diet, regular exercise, and multivitamin daily. Labs with PCP.  Postmenopausal - no HRT, no bleeding.   Osteopenia of multiple sites - Dexa 10/06/2019 T-score -1.7, FRAX 7.0% / 0.4%. Taking daily vitamin D supplement. Recommend regular exercise that incorporates resistance training. Will repeat DXA in 1 year.   Screening for cervical cancer - Normal Pap history.  Will repeat at 5-year interval per guidelines.  Screening for breast cancer - Normal mammogram history.  Continue annual screenings.  Normal breast exam today.  Screening for colon cancer - 2012 colonoscopy. Scheduled for endoscopy/colonoscopy next month. She is also having trouble with esophageal stricture.   Return in 1 year for annual.    Tamela Gammon DNP, 8:43 AM 10/17/2020

## 2020-10-25 ENCOUNTER — Other Ambulatory Visit: Payer: Self-pay

## 2020-10-25 DIAGNOSIS — F4322 Adjustment disorder with anxiety: Secondary | ICD-10-CM

## 2020-10-25 MED ORDER — SERTRALINE HCL 50 MG PO TABS: ORAL_TABLET | ORAL | 3 refills | Status: AC

## 2020-10-25 NOTE — Telephone Encounter (Signed)
AEX was 10/17/20.

## 2020-10-31 ENCOUNTER — Other Ambulatory Visit: Payer: Self-pay

## 2020-10-31 ENCOUNTER — Ambulatory Visit: Payer: 59

## 2020-10-31 VITALS — Ht 61.5 in | Wt 167.0 lb

## 2020-10-31 DIAGNOSIS — Z1211 Encounter for screening for malignant neoplasm of colon: Secondary | ICD-10-CM

## 2020-10-31 DIAGNOSIS — K219 Gastro-esophageal reflux disease without esophagitis: Secondary | ICD-10-CM

## 2020-10-31 DIAGNOSIS — R1310 Dysphagia, unspecified: Secondary | ICD-10-CM

## 2020-10-31 NOTE — Progress Notes (Signed)
No allergies to soy or egg Pt is not on blood thinners or diet pills Denies issues with sedation/intubation Denies atrial flutter/fib Denies constipation   Emmi instructions given to pt  Pt is aware of Covid safety and care partner requirements.  

## 2020-11-14 ENCOUNTER — Other Ambulatory Visit: Payer: Self-pay

## 2020-11-14 ENCOUNTER — Encounter: Payer: Self-pay | Admitting: Gastroenterology

## 2020-11-14 ENCOUNTER — Ambulatory Visit (AMBULATORY_SURGERY_CENTER): Payer: 59 | Admitting: Gastroenterology

## 2020-11-14 VITALS — BP 139/76 | HR 67 | Temp 97.5°F | Resp 17 | Ht 61.5 in | Wt 167.0 lb

## 2020-11-14 DIAGNOSIS — K635 Polyp of colon: Secondary | ICD-10-CM | POA: Diagnosis not present

## 2020-11-14 DIAGNOSIS — K449 Diaphragmatic hernia without obstruction or gangrene: Secondary | ICD-10-CM

## 2020-11-14 DIAGNOSIS — K222 Esophageal obstruction: Secondary | ICD-10-CM | POA: Diagnosis not present

## 2020-11-14 DIAGNOSIS — Z1211 Encounter for screening for malignant neoplasm of colon: Secondary | ICD-10-CM

## 2020-11-14 DIAGNOSIS — K219 Gastro-esophageal reflux disease without esophagitis: Secondary | ICD-10-CM

## 2020-11-14 DIAGNOSIS — D123 Benign neoplasm of transverse colon: Secondary | ICD-10-CM

## 2020-11-14 DIAGNOSIS — R1310 Dysphagia, unspecified: Secondary | ICD-10-CM | POA: Diagnosis not present

## 2020-11-14 MED ORDER — SODIUM CHLORIDE 0.9 % IV SOLN: 500.0000 mL | Freq: Once | INTRAVENOUS | Status: DC

## 2020-11-14 NOTE — Progress Notes (Signed)
PT taken to PACU. Monitors in place. VSS. Report given to RN. 

## 2020-11-14 NOTE — Op Note (Signed)
[facility] Patient Name: Amir Glaus Procedure Date: 11/14/2020 9:03 AM MRN: [id number] Endoscopist: Milus Banister , MD Age: 60 Referring MD:  Date of Birth: February 12, 1961 Gender: Female Account #: [id number] Procedure:                Upper GI endoscopy Indications:              Dysphagia; Schatzki's ring causing intermittent                            dysphagia. EGD October 2014 Schatzki's ring noted                            above a small hiatal hernia. Ring was dilated to 20                            mm with a TTS balloon. This helped her dysphagia                            for many years. Medicines:                Monitored Anesthesia Care Procedure:                Pre-Anesthesia Assessment:                           - Prior to the procedure, a History and Physical                            was performed, and patient medications and                            allergies were reviewed. The patient's tolerance of                            previous anesthesia was also reviewed. The risks                            and benefits of the procedure and the sedation                            options and risks were discussed with the patient.                            All questions were answered, and informed consent                            was obtained. Prior Anticoagulants: The patient has                            taken no previous anticoagulant or antiplatelet                            agents. ASA Grade Assessment: II - A patient with  mild systemic disease. After reviewing the risks                            and benefits, the patient was deemed in                            satisfactory condition to undergo the procedure.                           After obtaining informed consent, the endoscope was                            passed under direct vision. Throughout the                            procedure, the patient's blood pressure,  pulse, and                            oxygen saturations were monitored continuously. The                            Endoscope was introduced through the mouth, and                            advanced to the second part of duodenum. The upper                            GI endoscopy was accomplished without difficulty.                            The patient tolerated the procedure well. Scope In: Scope Out: Findings:                 One benign-appearing, intrinsic moderate stenosis                            was found at the gastroesophageal junction                            (Schatzki's ring vs. focal peptic stricture).                            Dilation with an 18-19-20 mm balloon dilator was                            performed to 18 mm. There was typical minor mucosal                            disruption and self limited bleeding following                            dilation.                           A medium-sized hiatal hernia was present.  The exam was otherwise without abnormality. Complications:            No immediate complications. Estimated blood loss:                            None. Estimated Blood Loss:     Estimated blood loss: none. 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                           - Medium-sized hiatal hernia.                           - The examination was otherwise normal. Recommendation:           - Patient has a contact number available for                            emergencies. The signs and symptoms of potential                            delayed complications were discussed with the                            patient. Return to normal activities tomorrow.                            Written discharge instructions were provided to the                            patient.                           - Resume previous diet.  Chew your food well, eat                            slowly and take small bites. Call Dr. Ardis Hughs'                            office to report on your response to this dilation                            in 4 weeks.                           - Continue present medications. Milus Banister, MD 11/14/2020 9:14:12 AM This report has been signed electronically.

## 2020-11-14 NOTE — Op Note (Signed)
[place] Patient Name: Hannah Hanson Procedure Date: 11/14/2020 8:38 AM MRN: [id number] Endoscopist: Milus Banister , MD Age: 60 Referring MD:  Date of Birth: 07-09-1960 Gender: Female Account #: [id number] Procedure:                Colonoscopy Indications:              Screening for colorectal malignant neoplasm Medicines:                Monitored Anesthesia Care Procedure:                Pre-Anesthesia Assessment:                           - Prior to the procedure, a History and Physical                            was performed, and patient medications and                            allergies were reviewed. The patient's tolerance of                            previous anesthesia was also reviewed. The risks                            and benefits of the procedure and the sedation                            options and risks were discussed with the patient.                            All questions were answered, and informed consent                            was obtained. Prior Anticoagulants: The patient has                            taken no previous anticoagulant or antiplatelet                            agents. ASA Grade Assessment: II - A patient with                            mild systemic disease. After reviewing the risks                            and benefits, the patient was deemed in                            satisfactory condition to undergo the procedure.                           After obtaining informed consent, the colonoscope  was passed under direct vision. Throughout the                            procedure, the patient's blood pressure, pulse, and                            oxygen saturations were monitored continuously. The                            Colonoscope was introduced through the anus and                            advanced to the the cecum, identified by                            appendiceal orifice and  ileocecal valve. The                            colonoscopy was performed without difficulty. The                            patient tolerated the procedure well. The quality                            of the bowel preparation was good. The ileocecal                            valve, appendiceal orifice, and rectum were                            photographed. Scope In: 8:42:32 AM Scope Out: 9:00:48 AM Scope Withdrawal Time: 0 hours 10 minutes 59 seconds  Total Procedure Duration: 0 hours 18 minutes 16 seconds  Findings:                 A 3 mm polyp was found in the transverse colon. The                            polyp was sessile. The polyp was removed with a                            cold snare. Resection and retrieval were complete.                           Multiple small and large-mouthed diverticula were                            found in the left colon.                           The exam was otherwise without abnormality on                            direct and retroflexion views. Complications:  No immediate complications. Estimated blood loss:                            Minimal. Estimated Blood Loss:     None Impression:               - One 3 mm polyp in the transverse colon, removed                            with a cold snare. Resected and retrieved.                           - Diverticulosis in the left colon.                           - The examination was otherwise normal on direct                            and retroflexion views. Recommendation:           - Await pathology results.                           - EGD now. Milus Banister, MD 11/14/2020 9:02:55 AM This report has been signed electronically.

## 2020-11-14 NOTE — Progress Notes (Signed)
Medical history reviewed with no changes noted. VS assessed by N.C 

## 2020-11-14 NOTE — Patient Instructions (Signed)
Handouts on polyps, diverticulosis, hiatal hernia, and stricture given to you today   YOU HAD AN ENDOSCOPIC PROCEDURE TODAY AT [facility]:   Refer to the procedure report that was given to you for any specific questions about what was found during the examination.  If the procedure report does not answer your questions, please call your gastroenterologist to clarify.  If you requested that your care partner not be given the details of your procedure findings, then the procedure report has been included in a sealed envelope for you to review at your convenience later.  YOU SHOULD EXPECT: Some feelings of bloating in the abdomen. Passage of more gas than usual.  Walking can help get rid of the air that was put into your GI tract during the procedure and reduce the bloating. If you had a lower endoscopy (such as a colonoscopy or flexible sigmoidoscopy) you may notice spotting of blood in your stool or on the toilet paper. If you underwent a bowel prep for your procedure, you may not have a normal bowel movement for a few days.  Please Note:  You might notice some irritation and congestion in your nose or some drainage.  This is from the oxygen used during your procedure.  There is no need for concern and it should clear up in a day or so.  SYMPTOMS TO REPORT IMMEDIATELY:  Following lower endoscopy (colonoscopy or flexible sigmoidoscopy):  Excessive amounts of blood in the stool  Significant tenderness or worsening of abdominal pains  Swelling of the abdomen that is new, acute  Fever of 100F or higher  Following upper endoscopy (EGD)  Vomiting of blood or coffee ground material  New chest pain or pain under the shoulder blades  Painful or persistently difficult swallowing  New shortness of breath  Fever of 100F or higher  Black, tarry-looking stools  For urgent or emergent issues, a gastroenterologist can be reached at any hour by calling [phone]. Do not use MyChart  messaging for urgent concerns.    DIET:  We do recommend a small meal at first, but then you may proceed to your regular diet.  Drink plenty of fluids but you should avoid alcoholic beverages for 24 hours.  ACTIVITY:  You should plan to take it easy for the rest of today and you should NOT DRIVE or use heavy machinery until tomorrow (because of the sedation medicines used during the test).    FOLLOW UP: Our staff will call the number listed on your records 48-72 hours following your procedure to check on you and address any questions or concerns that you may have regarding the information given to you following your procedure. If we do not reach you, we will leave a message.  We will attempt to reach you two times.  During this call, we will ask if you have developed any symptoms of COVID 19. If you develop any symptoms (ie: fever, flu-like symptoms, shortness of breath, cough etc.) before then, please call [phone].  If you test positive for Covid 19 in the 2 weeks post procedure, please call and report this information to [place].    If any biopsies were taken you will be contacted by phone or by letter within the next 1-3 weeks.  Please call us at [phone] if you have not heard about the biopsies in 3 weeks.    SIGNATURES/CONFIDENTIALITY: You and/or your care partner have signed paperwork which will be entered into your electronic medical record.  These signatures attest to the fact that that the information above on your After Visit Summary has been reviewed and is understood.  Full responsibility of the confidentiality of this discharge information lies with you and/or your care-partner.  

## 2020-11-14 NOTE — Progress Notes (Signed)
Called to room to assist during endoscopic procedure.  Patient ID and intended procedure confirmed with present staff. Received instructions for my participation in the procedure from the performing physician.  

## 2020-11-16 ENCOUNTER — Telehealth: Payer: Self-pay | Admitting: *Deleted

## 2020-11-16 NOTE — Telephone Encounter (Signed)
  Follow up Call-  Call back number 11/14/2020  Post procedure Call Back phone  # [phone]  Permission to leave phone message Yes  Some recent data might be hidden     Patient questions:  Do you have a fever, pain , or abdominal swelling? No. Pain Score  0 *  Have you tolerated food without any problems? Yes.    Have you been able to return to your normal activities? Yes.    Do you have any questions about your discharge instructions: Diet   No. Medications  No. Follow up visit  No.  Do you have questions or concerns about your Care? No.  Actions: * If pain score is 4 or above: No action needed, pain <4.

## 2020-11-17 ENCOUNTER — Encounter: Payer: Self-pay | Admitting: Gastroenterology

## 2020-12-14 ENCOUNTER — Encounter: Payer: 59 | Attending: Family Medicine | Admitting: Registered"

## 2020-12-14 ENCOUNTER — Encounter: Payer: Self-pay | Admitting: Registered"

## 2020-12-14 ENCOUNTER — Other Ambulatory Visit: Payer: Self-pay

## 2020-12-14 DIAGNOSIS — E119 Type 2 diabetes mellitus without complications: Secondary | ICD-10-CM | POA: Diagnosis present

## 2020-12-14 NOTE — Patient Instructions (Signed)
Goals: Follow Diabetes Meal Plan as instructed Eat 3 meals and 2 snacks, every 3-5 hrs Balance meals with 1/2 plate of non-starch vegetables, 1/4 plate of carbohydrates, 1/4 plate of lean protein.  Limit carbohydrate intake to 30-45 grams carbohydrate/meal Limit carbohydrate intake to 15-30 grams carbohydrate/snack Add lean protein foods to meals/snacks Aim for 15-20 mins of physical activity (swimming) 2x/week before noon

## 2020-12-14 NOTE — Progress Notes (Addendum)
Diabetes Self-Management Education  Visit Type:  First/Initial  Appt. Start Time: 2:20  Appt. End Time: 3:25  12/14/2020  [name], identified by name and date of birth, is a 60 y.o. female with a diagnosis of Diabetes:  .   ASSESSMENT  States mom had early diagnosis of diabetes and has managed well with diet and exercise. Pt states she wants to learn how to do the same. Reports history of gestational diabetes with last pregnancy.   Recent labs reveal elevated Chol (232), HDL (42), and elevated LDL Chol Calc (171). Referral states elevated A1C (6.6) in 06/2020 was the first time pt's A1C has been in diabetic range.   Pt states she knows what she should be staying away from (sweet tea, sweets, too much bread). States bread is a weakness for her. Reports her favorites are bread and chocolate.   States she likes to walk outside but its too hot right now. States she likes to swim and has access to an outdoor pool in neighborhood.   Pt expectations: guidelines on what types of foods she should be eating  Last menstrual period 04/13/2015. There is no height or weight on file to calculate BMI.    Diabetes Self-Management Education - 12/14/20 1426       Health Coping   How would you rate your overall health? Fair      Psychosocial Assessment   Patient Belief/Attitude about Diabetes Denial    Self-care barriers None    Self-management support Friends;Church    Patient Concerns Nutrition/Meal planning    Special Needs None    Learning Readiness Change in progress      Complications   Last HgB A1C per patient/outside source 6.2 %    How often do you check your blood sugar? 0 times/day (not testing)    Number of hypoglycemic episodes per month 4    Can you tell when your blood sugar is low? Yes    What do you do if your blood sugar is low? will eat or drink something    Number of hyperglycemic episodes per week 0    Can you tell when your blood sugar is high? No    Have you  had a dilated eye exam in the past 12 months? No    Have you had a dental exam in the past 12 months? Yes    Are you checking your feet? No      Dietary Intake   Breakfast [employer] - egg & ham McMuffin + orange juice    Lunch leftovers - chicken pie (chicken, cream of chicken)    Snack (afternoon) 2 fig newtons    Dinner leftovers - chicken pie + slaw    Snack (evening) 1 slice of bread + peanut butter + milk + 2 fig newtons    Beverage(s) 2% milk, orange juice, [place] with raspberry with stevia (3*12; 36 oz), water (2-3*16; 32-48 oz)      Exercise   Exercise Type ADL's    How many days per week to you exercise? 0    How many minutes per day do you exercise? 0    Total minutes per week of exercise 0      Patient Education   Disease state  Definition of diabetes, type 1 and 2, and the diagnosis of diabetes;Factors that contribute to the development of diabetes    Nutrition management  Role of diet in the treatment of diabetes and the relationship between the three  main macronutrients and blood glucose level;Reviewed blood glucose goals for pre and post meals and how to evaluate the patients' food intake on their blood glucose level.;Effects of alcohol on blood glucose and safety factors with consumption of alcohol.;Information on hints to eating out and maintain blood glucose control.;Meal options for control of blood glucose level and chronic complications.;Food label reading, portion sizes and measuring food.;Carbohydrate counting    Physical activity and exercise  Helped patient identify appropriate exercises in relation to his/her diabetes, diabetes complications and other health issue.    Monitoring Identified appropriate SMBG and/or A1C goals.    Acute complications Taught treatment of hypoglycemia - the 15 rule.;Discussed and identified patients' treatment of hyperglycemia.    Chronic complications Relationship between chronic complications and blood glucose control;Lipid levels,  blood glucose control and heart disease;Identified and discussed with patient  current chronic complications;Reviewed with patient heart disease, higher risk of, and prevention    Psychosocial adjustment Role of stress on diabetes;Helped patient identify a support system for diabetes management;Identified and addressed patients feelings and concerns about diabetes    Personal strategies to promote health Lifestyle issues that need to be addressed for better diabetes care      Individualized Goals (developed by patient)   Nutrition General guidelines for healthy choices and portions discussed    Physical Activity Exercise 1-2 times per week;15 minutes per day    Medications Not Applicable    Monitoring  Not Applicable    Reducing Risk treat hypoglycemia with 15 grams of carbs if blood glucose less than '70mg'$ /dL      Post-Education Assessment   Patient understands the diabetes disease and treatment process. Demonstrates understanding / competency    Patient understands incorporating nutritional management into lifestyle. Demonstrates understanding / competency    Patient undertands incorporating physical activity into lifestyle. Demonstrates understanding / competency    Patient understands using medications safely. Demonstrates understanding / competency    Patient understands monitoring blood glucose, interpreting and using results Demonstrates understanding / competency    Patient understands prevention, detection, and treatment of acute complications. Demonstrates understanding / competency    Patient understands prevention, detection, and treatment of chronic complications. Needs Review    Patient understands how to develop strategies to address psychosocial issues. Demonstrates understanding / competency    Patient understands how to develop strategies to promote health/change behavior. Demonstrates understanding / competency      Outcomes   Program Status Not Completed              Learning Objective:  Patient will have a greater understanding of diabetes self-management. Patient education plan is to attend individual and/or group sessions per assessed needs and concerns.   Plan:   Patient Instructions  Goals: Follow Diabetes Meal Plan as instructed Eat 3 meals and 2 snacks, every 3-5 hrs Balance meals with 1/2 plate of non-starch vegetables, 1/4 plate of carbohydrates, 1/4 plate of lean protein.  Limit carbohydrate intake to 30-45 grams carbohydrate/meal Limit carbohydrate intake to 15-30 grams carbohydrate/snack Add lean protein foods to meals/snacks Aim for 15-20 mins of physical activity (swimming) 2x/week before noon       Expected Outcomes:  Demonstrated interest in learning. Expect positive outcomes  Education material provided: ADA - How to Thrive: A Guide for Your Journey with Diabetes  If problems or questions, patient to contact team via:  Phone and Email  Future DSME appointment: - 2 months

## 2021-02-06 ENCOUNTER — Ambulatory Visit: Payer: 59 | Admitting: Dietician

## 2021-02-07 ENCOUNTER — Ambulatory Visit: Payer: 59 | Admitting: Dietician

## 2021-09-27 ENCOUNTER — Telehealth: Payer: Self-pay | Admitting: Gastroenterology

## 2021-09-27 NOTE — Telephone Encounter (Signed)
The pt had EGD last year with dilation for Dysphagia; Schatzki's ring.  She has begun to have trouble swallowing and would like to know if she needs to have another EGD dil now.  Please advise

## 2021-09-27 NOTE — Telephone Encounter (Signed)
Patient called states she is having issues with her throat again and would like to get further advise. Had EGD done last year.

## 2021-09-28 MED ORDER — OMEPRAZOLE 40 MG PO CPDR: 40.0000 mg | DELAYED_RELEASE_CAPSULE | Freq: Every day | ORAL | 3 refills | Status: AC

## 2021-09-28 NOTE — Telephone Encounter (Signed)
The pt has been given the recommendations per Dr Ardis Hughs.  She now would like to try the omeprazole and see how she does.  If her symptoms do not improve she will call back to schedule EGD.  Omeprazole sent to the pharmacy.

## 2021-10-09 DIAGNOSIS — R7303 Prediabetes: Secondary | ICD-10-CM | POA: Diagnosis not present

## 2021-10-09 DIAGNOSIS — R238 Other skin changes: Secondary | ICD-10-CM | POA: Diagnosis not present

## 2021-10-09 DIAGNOSIS — E2839 Other primary ovarian failure: Secondary | ICD-10-CM | POA: Diagnosis not present

## 2021-10-09 DIAGNOSIS — R69 Illness, unspecified: Secondary | ICD-10-CM | POA: Diagnosis not present

## 2021-10-09 DIAGNOSIS — E039 Hypothyroidism, unspecified: Secondary | ICD-10-CM | POA: Diagnosis not present

## 2021-10-09 DIAGNOSIS — E7800 Pure hypercholesterolemia, unspecified: Secondary | ICD-10-CM | POA: Diagnosis not present

## 2021-10-09 DIAGNOSIS — Z1231 Encounter for screening mammogram for malignant neoplasm of breast: Secondary | ICD-10-CM | POA: Diagnosis not present

## 2021-10-09 DIAGNOSIS — I10 Essential (primary) hypertension: Secondary | ICD-10-CM | POA: Diagnosis not present

## 2021-10-10 ENCOUNTER — Other Ambulatory Visit: Payer: Self-pay | Admitting: Family Medicine

## 2021-10-10 DIAGNOSIS — Z1231 Encounter for screening mammogram for malignant neoplasm of breast: Secondary | ICD-10-CM

## 2021-10-12 ENCOUNTER — Encounter: Payer: Self-pay | Admitting: *Deleted

## 2021-10-18 NOTE — Progress Notes (Deleted)
    10/18/2021 Hannah Hanson [id number] [birth year]  Referring provider: Leonides Sake, MD Primary GI doctor: Dr. Ardis Hughs  ASSESSMENT AND PLAN:   There are no diagnoses linked to this encounter.  History of Present Illness:  61 y.o. female  with a past medical history of hypertension, hypothyroidism reflux, benign stricture status post dilatation, personal history of colon polyps and others listed below, returns to clinic today for evaluation of dysphagia. 04/2011 routine risk colonoscopy normal recall 10 years (04/2021) 02/2013 GD Schatzki ring causing intermittent dysphagia dilated 20 mm, small hiatal hernia 06/21/2020 office visit with Dr. Ardis Hughs for reflux with dysphagia 11/14/2020 endoscopy and colonoscopy  Medium size hiatal hernia, GE junction stricture dilated to 18 mm  Bowel preparation good 3 mm sessile polyp, diverticulosis-recall 5 years 09/27/2021 patient called with trouble swallowing, started on omeprazole 40 mg daily famotidine stopped.  If patient's symptoms or not improved will do endoscopy. Current Medications:   Current Outpatient Medications (Endocrine & Metabolic):    levothyroxine (SYNTHROID) 100 MCG tablet, Take 100 mcg by mouth daily.  Current Outpatient Medications (Cardiovascular):    lisinopril (PRINIVIL,ZESTRIL) 10 MG tablet, Take 1 tablet (10 mg total) by mouth daily.  Current Outpatient Medications (Respiratory):    loratadine (CLARITIN) 10 MG tablet, Take 10 mg by mouth daily.    Current Outpatient Medications (Other):    cholecalciferol (VITAMIN D3) 25 MCG (1000 UNIT) tablet, Take 1,000 Units by mouth daily.   famotidine (PEPCID) 20 MG tablet, Take 20 mg by mouth daily.   omeprazole (PRILOSEC) 40 MG capsule, Take 1 capsule (40 mg total) by mouth daily.   sertraline (ZOLOFT) 50 MG tablet, TAKE 1 TABLET(50 MG) BY MOUTH DAILY   vitamin C (ASCORBIC ACID) 500 MG tablet, Take 500 mg by mouth daily. (Patient not taking: Reported on  11/14/2020)  Surgical History:  She  has a past surgical history that includes Tubal ligation; Foot surgery; Cesarean section (1985, [id number]); and Carpal tunnel release (Right). Family History:  Her family history includes Diabetes in her brother, maternal grandfather, and mother; Heart disease in her father and paternal grandfather; Hypertension in her mother. Social History:   reports that she has quit smoking. Her smoking use included cigarettes. She has never used smokeless tobacco. She reports that she does not drink alcohol and does not use drugs.  Current Medications, Allergies, Past Medical History, Past Surgical History, Family History and Social History were reviewed in [employer] record.  Physical Exam: LMP 04/13/2015  General:   Pleasant, well developed female in no acute distress Heart : Regular rate and rhythm; no murmurs Pulm: Clear anteriorly; no wheezing Abdomen:  {BlankSingle:19197::"Distended","Ridged","Soft"}, {BlankSingle:19197::"Flat","Obese","Non-distended"} AB, {BlankSingle:19197::"Absent","Hyperactive, tinkling","Hypoactive","Sluggish","Active"} bowel sounds. {actendernessAB:27319} tenderness {anatomy; site abdomen:5010}. {BlankMultiple:19196::"Without guarding","With guarding","Without rebound","With rebound"}, No organomegaly appreciated. Rectal: {acrectalexam:27461} Extremities:  {With/without:5700}  edema. Neurologic:  Alert and  oriented x4;  No focal deficits.  Psych:  Cooperative. Normal mood and affect.   Vladimir Crofts, PA-C 10/18/21

## 2021-10-19 ENCOUNTER — Ambulatory Visit: Payer: Self-pay | Admitting: Physician Assistant

## 2021-10-19 DIAGNOSIS — K219 Gastro-esophageal reflux disease without esophagitis: Secondary | ICD-10-CM

## 2021-10-19 DIAGNOSIS — D123 Benign neoplasm of transverse colon: Secondary | ICD-10-CM

## 2021-10-19 DIAGNOSIS — R1310 Dysphagia, unspecified: Secondary | ICD-10-CM

## 2021-12-28 DIAGNOSIS — R0683 Snoring: Secondary | ICD-10-CM | POA: Diagnosis not present

## 2021-12-28 DIAGNOSIS — G4733 Obstructive sleep apnea (adult) (pediatric): Secondary | ICD-10-CM | POA: Diagnosis not present

## 2021-12-28 DIAGNOSIS — G4719 Other hypersomnia: Secondary | ICD-10-CM | POA: Diagnosis not present

## 2022-01-09 DIAGNOSIS — L304 Erythema intertrigo: Secondary | ICD-10-CM | POA: Diagnosis not present

## 2022-01-09 DIAGNOSIS — L820 Inflamed seborrheic keratosis: Secondary | ICD-10-CM | POA: Diagnosis not present

## 2022-01-31 DIAGNOSIS — G4733 Obstructive sleep apnea (adult) (pediatric): Secondary | ICD-10-CM | POA: Diagnosis not present

## 2022-02-20 IMAGING — MG MM DIGITAL DIAGNOSTIC UNILAT*L* W/ TOMO W/ CAD
8 series · 9 of 24 positions shown · non-contrast
Comparison: 10/06/2019 and prior mammograms dating back to
08/02/2008.

CLINICAL DATA: 58-year-old female for further evaluation of
possible LEFT breast distortion on screening mammogram.

EXAM:
DIGITAL DIAGNOSTIC LEFT MAMMOGRAM WITH CAD AND TOMO
ULTRASOUND LEFT BREAST

[L MLO synth-2D (1 of 2)]
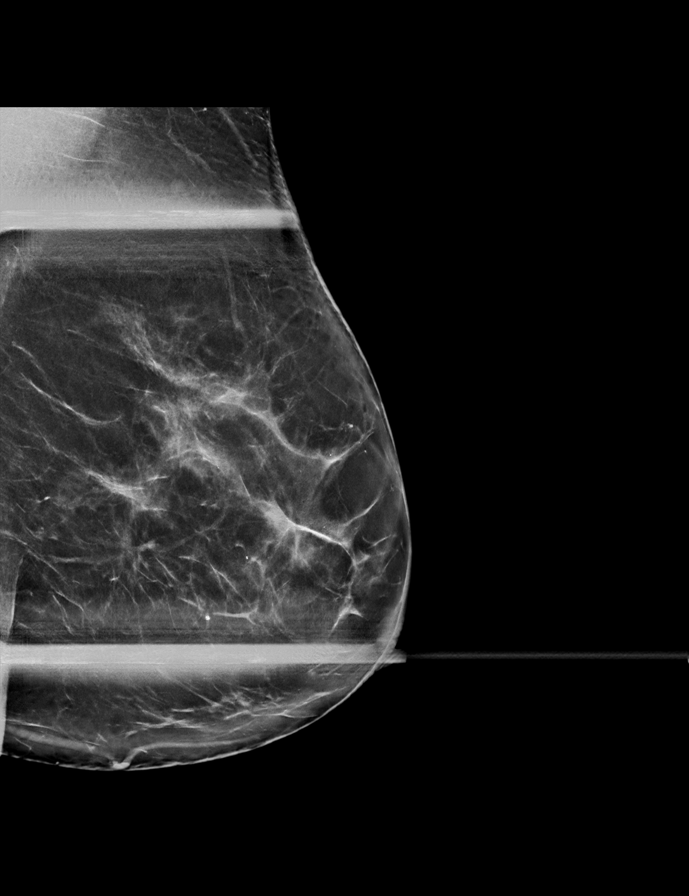

[L MLO synth-2D (2 of 2)]
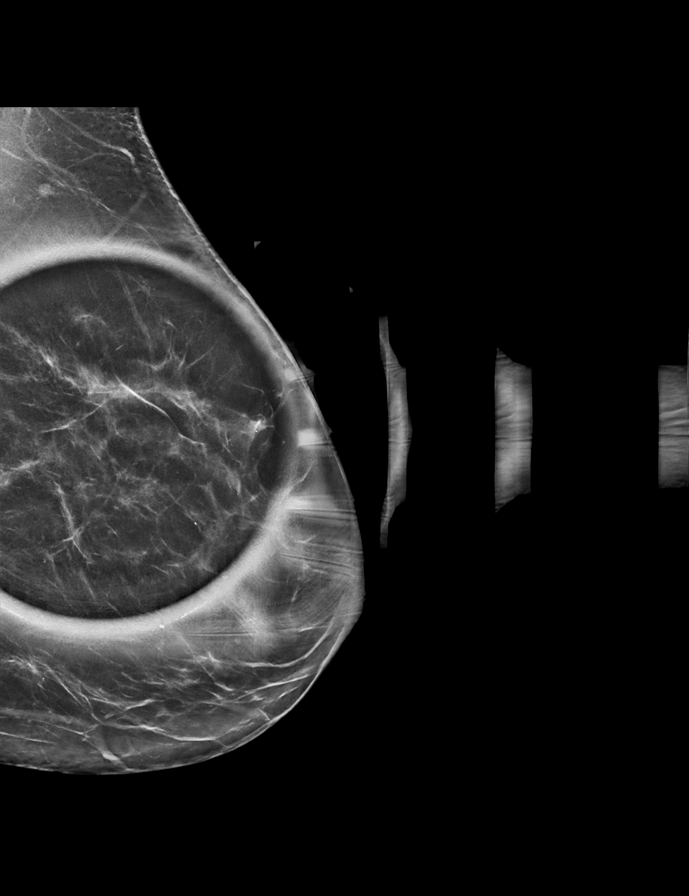

[L CC synth-2D (1 of 2)]
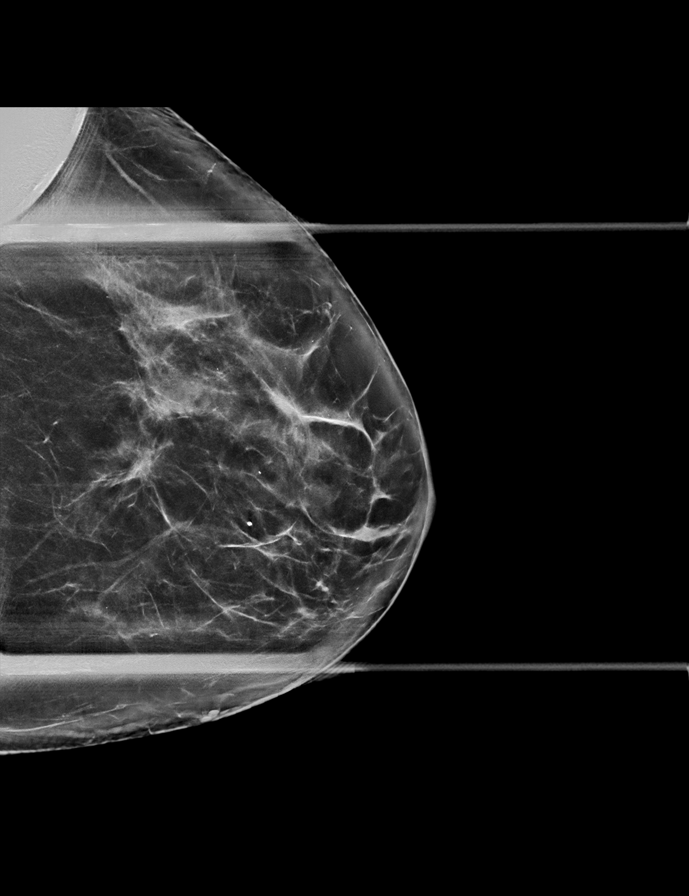

[L CC synth-2D (2 of 2)]
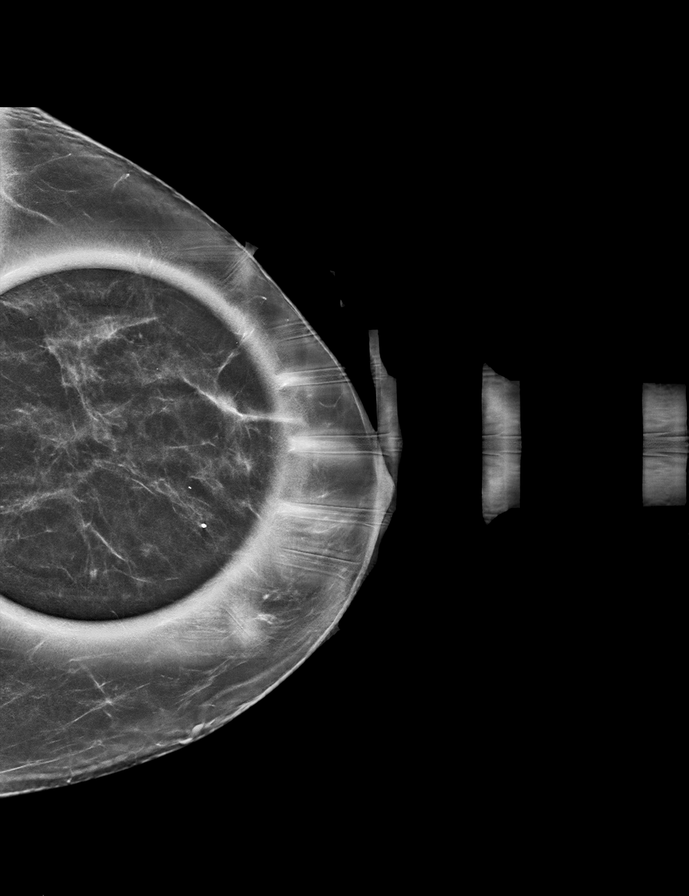

[L MLO tomo · 2 of 72 frames shown (1 of 2)]
[frame 24/72]
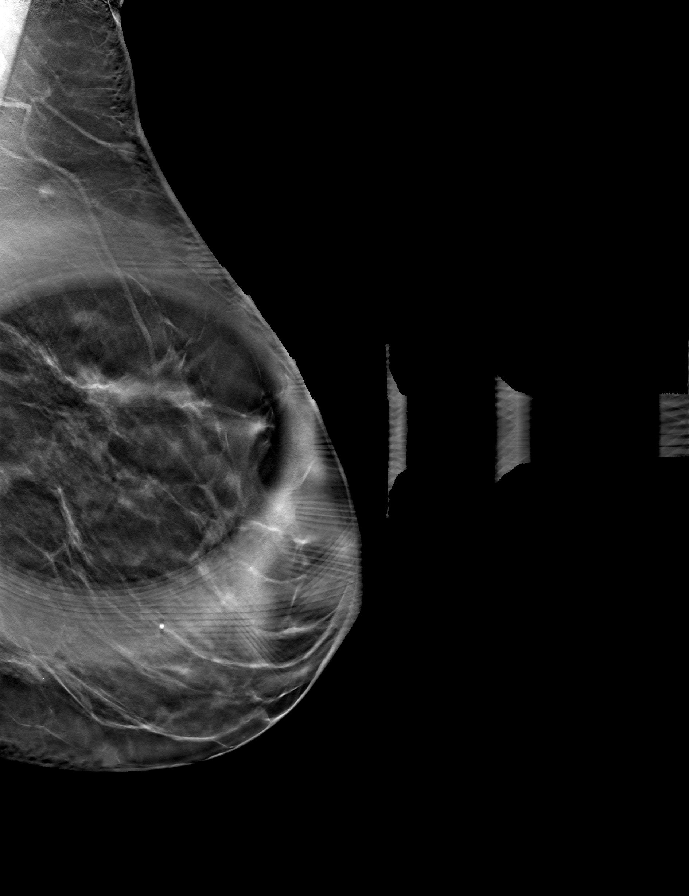
[frame 37/72]
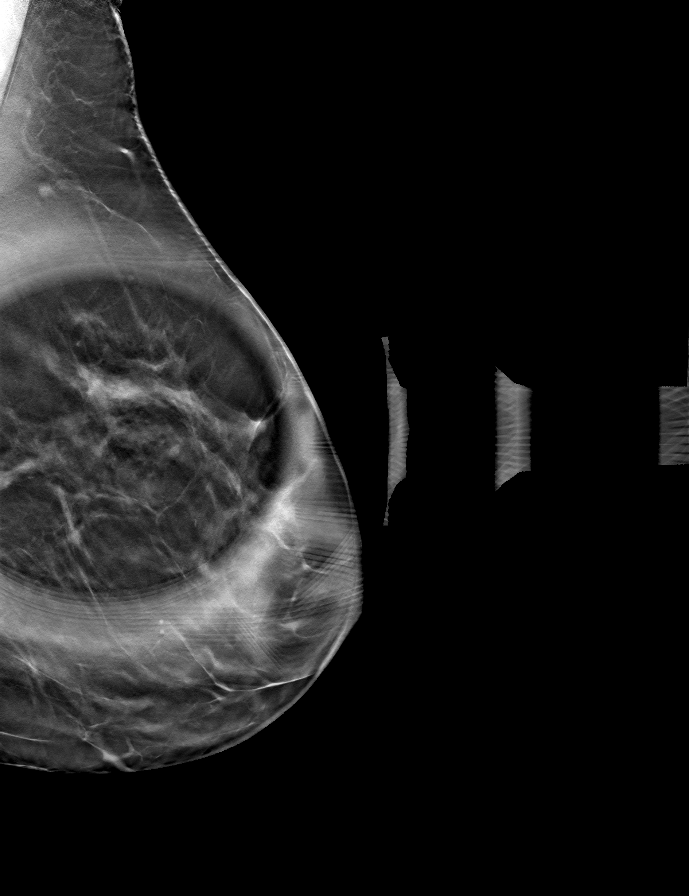

[L CC tomo (1 of 2) · tomo slice 31/62.0]
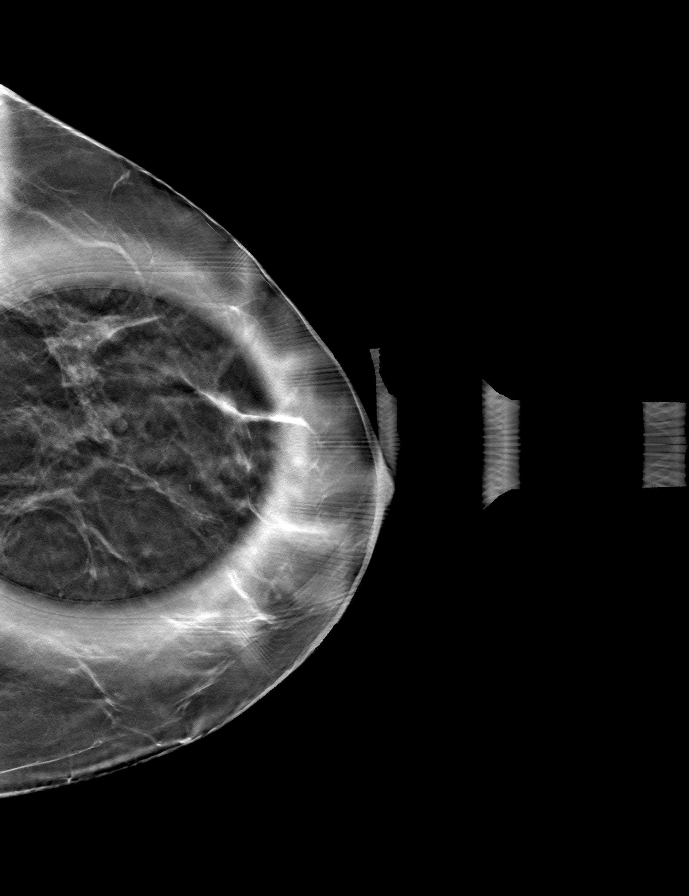

[L CC tomo (2 of 2) · tomo slice 39/77.0]
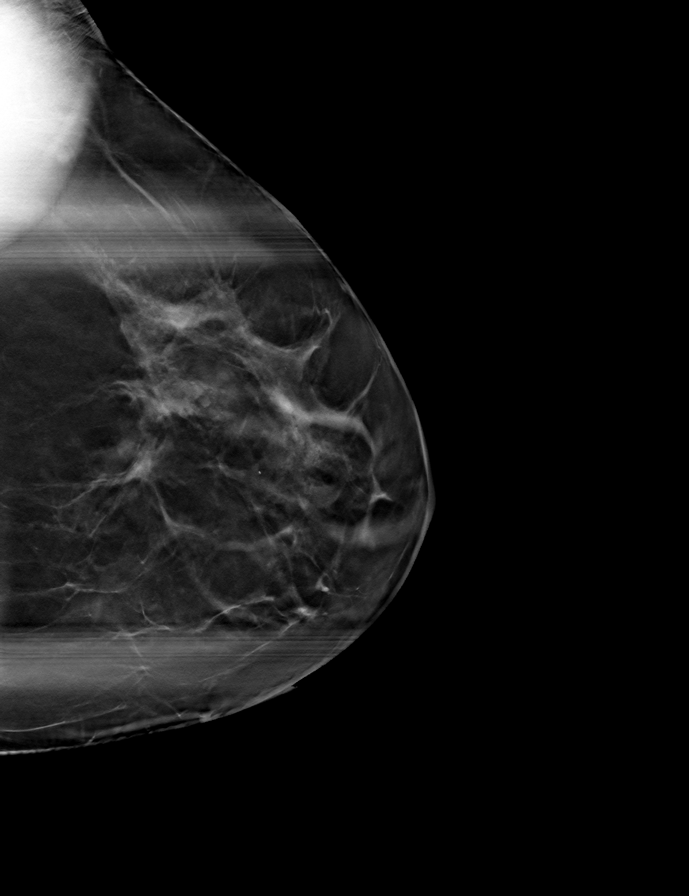

[L MLO tomo (2 of 2) · tomo slice 41/81.0]
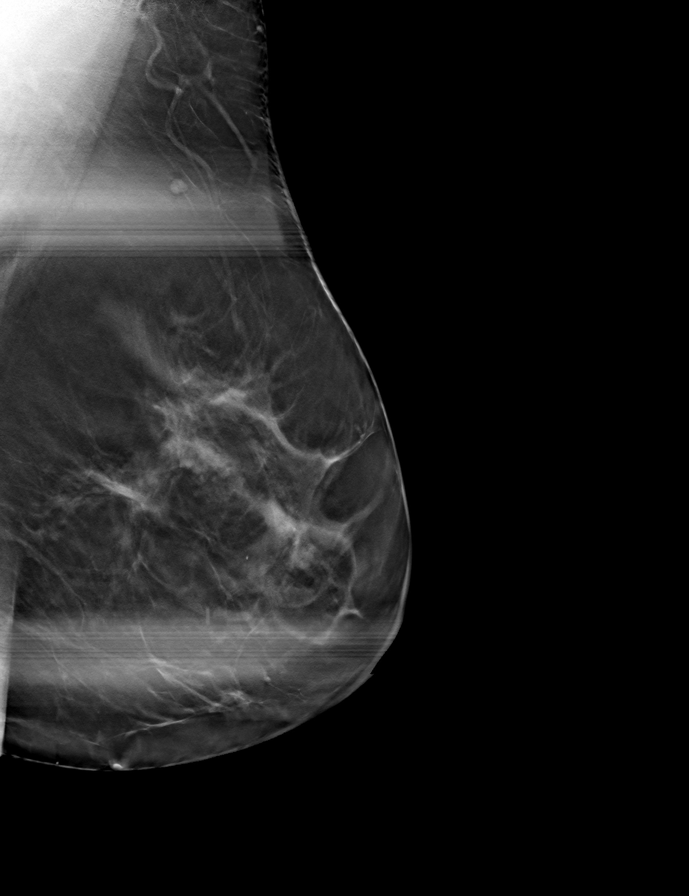

[9 of 24 positions shown; findings below may reference images not displayed]

ACR Breast Density Category c: The breast tissue is heterogeneously
dense, which may obscure small masses.
FINDINGS: 2D/3D spot compression views of the LEFT breast demonstrate no
persistent distortion in the area of the screening study findings.
On today's images, this area has a similar appearance to 4777
studies.

Mammographic images were processed with CAD.

Targeted ultrasound is performed, showing no sonographic
abnormalities within the central or UPPER RIGHT breast.
IMPRESSION: No persistent mammographic or sonographic abnormality in the area of
the screening study finding.

RECOMMENDATION:
Bilateral screening mammogram in 1 year.

I have discussed the findings and recommendations with the patient.
If applicable, a reminder letter will be sent to the patient
regarding the next appointment.

BI-RADS CATEGORY  1: Negative.

## 2022-03-02 DIAGNOSIS — G4733 Obstructive sleep apnea (adult) (pediatric): Secondary | ICD-10-CM | POA: Diagnosis not present

## 2022-03-07 DIAGNOSIS — G4733 Obstructive sleep apnea (adult) (pediatric): Secondary | ICD-10-CM | POA: Diagnosis not present

## 2022-04-02 DIAGNOSIS — G4733 Obstructive sleep apnea (adult) (pediatric): Secondary | ICD-10-CM | POA: Diagnosis not present

## 2022-05-02 DIAGNOSIS — G4733 Obstructive sleep apnea (adult) (pediatric): Secondary | ICD-10-CM | POA: Diagnosis not present

## 2022-08-30 ENCOUNTER — Ambulatory Visit: Payer: Self-pay | Admitting: Obstetrics & Gynecology

## 2022-09-10 ENCOUNTER — Ambulatory Visit: Payer: BLUE CROSS/BLUE SHIELD | Admitting: Obstetrics & Gynecology

## 2022-11-26 ENCOUNTER — Other Ambulatory Visit: Payer: Self-pay | Admitting: Family Medicine

## 2022-11-26 DIAGNOSIS — Z1231 Encounter for screening mammogram for malignant neoplasm of breast: Secondary | ICD-10-CM
